# Patient Record
Sex: Male | Born: 1975 | Race: Black or African American | Hispanic: No | Marital: Single | Smoking: Former smoker
Health system: Southern US, Community
[De-identification: ages and names within clinical notes are randomized; demographics above are authoritative.]

## PROBLEM LIST (undated history)

## (undated) DIAGNOSIS — Z87828 Personal history of other (healed) physical injury and trauma: Secondary | ICD-10-CM

## (undated) DIAGNOSIS — J45909 Unspecified asthma, uncomplicated: Secondary | ICD-10-CM

## (undated) DIAGNOSIS — I639 Cerebral infarction, unspecified: Secondary | ICD-10-CM

## (undated) DIAGNOSIS — I10 Essential (primary) hypertension: Secondary | ICD-10-CM

## (undated) DIAGNOSIS — E785 Hyperlipidemia, unspecified: Secondary | ICD-10-CM

## (undated) DIAGNOSIS — G4730 Sleep apnea, unspecified: Secondary | ICD-10-CM

## (undated) DIAGNOSIS — Z8619 Personal history of other infectious and parasitic diseases: Secondary | ICD-10-CM

## (undated) DIAGNOSIS — T7840XA Allergy, unspecified, initial encounter: Secondary | ICD-10-CM

## (undated) DIAGNOSIS — K219 Gastro-esophageal reflux disease without esophagitis: Secondary | ICD-10-CM

## (undated) DIAGNOSIS — I82409 Acute embolism and thrombosis of unspecified deep veins of unspecified lower extremity: Secondary | ICD-10-CM

## (undated) HISTORY — PX: NASAL POLYP SURGERY: SHX186

## (undated) HISTORY — DX: Allergy, unspecified, initial encounter: T78.40XA

## (undated) HISTORY — DX: Personal history of other (healed) physical injury and trauma: Z87.828

## (undated) HISTORY — DX: Acute embolism and thrombosis of unspecified deep veins of unspecified lower extremity: I82.409

## (undated) HISTORY — DX: Sleep apnea, unspecified: G47.30

## (undated) HISTORY — DX: Personal history of other infectious and parasitic diseases: Z86.19

## (undated) HISTORY — DX: Hyperlipidemia, unspecified: E78.5

---

## 2000-02-13 ENCOUNTER — Emergency Department (HOSPITAL_COMMUNITY): Admission: EM | Admit: 2000-02-13 | Discharge: 2000-02-13 | Payer: Self-pay | Admitting: Emergency Medicine

## 2000-02-13 ENCOUNTER — Encounter: Payer: Self-pay | Admitting: Emergency Medicine

## 2008-02-05 ENCOUNTER — Emergency Department (HOSPITAL_BASED_OUTPATIENT_CLINIC_OR_DEPARTMENT_OTHER): Admission: EM | Admit: 2008-02-05 | Discharge: 2008-02-06 | Payer: Self-pay | Admitting: Emergency Medicine

## 2008-02-11 ENCOUNTER — Encounter: Payer: Self-pay | Admitting: Emergency Medicine

## 2008-02-12 ENCOUNTER — Inpatient Hospital Stay (HOSPITAL_COMMUNITY): Admission: EM | Admit: 2008-02-12 | Discharge: 2008-02-17 | Payer: Self-pay | Admitting: Internal Medicine
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
## 2010-09-16 NOTE — H&P (Signed)
NAME:  [name], [name] NO.:  [id number]   MEDICAL RECORD NO.:  [id number]          PATIENT TYPE:  INP   LOCATION:  2917                         FACILITY:  [facility]   PHYSICIAN:  Vania Rea, M.D. DATE OF BIRTH:  01-23-1976   DATE OF ADMISSION:  02/12/2008  DATE OF DISCHARGE:                              HISTORY & PHYSICAL   PRIMARY CARE PHYSICIAN:  Unassigned.   CHIEF COMPLAINT:  Persistent cough.   HISTORY OF PRESENT ILLNESS:  This is a 35 year old African American  gentleman, who is in good health until about 8-10 days ago when he  developed a sore throat, fever, chills, pains all over, and cough.  He  presented to the emergency room on February 05, 2008, was evaluated and a  chest x-ray done.  Chest x-ray showed no abnormality and he was  discharged home on symptomatic treatment, advised to return if he got  worse.  The patient got worse with recurring fever, chills, chest pain,  coughing up clear sputum and fevers, returned to the emergency room  yesterday evening and chest x-ray revealed bilateral upper lobe  pneumonia.  Hospital service was called to assist with management.   The patient denies any runny nose or sneezing and he denies any sick  contacts.   PAST MEDICAL HISTORY:  He does have a history of asthma, but uses  bronchodilators only rarely, last attack was back in summer.  He has a  history of acute renal failure secondary to Toradol.   ALLERGIES:  ROBAXIN and PENICILLIN causes hives.  TORADOL put him into  acute renal failure.   SOCIAL HISTORY:  He smokes half a pack per day, but discontinued for  about 2 weeks.  Denies alcohol or illicit drug use.  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   FAMILY HISTORY:  Significant for asthma, but otherwise unremarkable.   REVIEW OF SYSTEMS:  Other than noted above.  A 10-point review of  systems was unremarkable.   PHYSICAL EXAMINATION:  GENERAL:  An ill-looking young African American  gentleman  lying in bed, coughing frequently, and tachypneic.  VITAL SIGNS:  Initially in the emergency room, his temperature was  103.4, his pulse was 130, respiratory rate 32, and he was sating at 92%.  Currently his temperature is down to 98.7, blood pressure 130/88, pulse  is 84, respirations 20.  He is sating at 100% on oxygen.  HEENT:  His pupils are round and equal.  Mucous membranes pink and  anicteric.  He is mildly dehydrated.  NECK:  He has no cervical lymphadenopathy or thyromegaly.  CHEST:  He has coarse crackles down the right side of his chest.  CARDIOVASCULAR:  Regular rhythm.  ABDOMEN:  Soft and nontender.  EXTREMITIES:  Without edema.  He has 2+ pulses bilaterally.  CENTRAL NERVOUS SYSTEM:  Cranial nerves II through XII grossly intact  and he has no focal neurologic deficits.   LABORATORY DATA:  His white count is 16.  His hemoglobin 12.8, platelets  276.  His serum chemistry is notable only for glucose of 114, otherwise  unremarkable.  His lactic acid is 1.6.  His urine drug screen is  negative.  His urinalysis apart from specific gravity of 1.028 is  unremarkable.  His chest x-ray shows bilateral upper lobe pneumonia.   ASSESSMENT:  1. Bilateral pneumonia, likely viral or bacterial secondary to viral.  2. Dehydration, much improved.   PLAN:  I agree with having this gentleman step-down for hydration.  His  physical exam sounds much worse than a chest x-ray report.  We will  cover him with antibiotics for staph as well as give him p.r.n.  nebulizations and oxygen supplement.  Other plans as per orders.      Vania Rea, M.D.  Electronically Signed     LC/MEDQ  D:  02/12/2008  T:  02/12/2008  Job:  [id number]

## 2010-09-16 NOTE — Discharge Summary (Signed)
NAME:  [name] NO.:  [id number]   MEDICAL RECORD NO.:  [id number]          PATIENT TYPE:  INP   LOCATION:  5036                         FACILITY:  [facility]   PHYSICIAN:  Marcellus Scott, MD     DATE OF BIRTH:  1975/09/24   DATE OF ADMISSION:  02/12/2008  DATE OF DISCHARGE:                               DISCHARGE SUMMARY   PRIMARY MEDICAL DOCTOR:  Unassigned.   DISCHARGE DIAGNOSES:  1. Bilateral pneumonia.  2. Anemia - iron deficiency versus of chronic disease.  3. Tobacco abuse.  4. Leukocytosis - resolved.  5. Asthma - stable.   DISCHARGE MEDICATIONS:  1. Avelox 400 mg p.o. daily for 5 days then discontinue.  2. Ferrous sulfate 325 mg p.o. b.i.d.  3. Senokot-S 1 p.o. daily as needed.  4. Albuterol 90 mcg per spray MDI 2 puffs inhaled q.4-6h. p.r.n.  5. Tylenol 650 mg p.o. q.4-6h. p.r.n.   PROCEDURES:  1. Chest x-ray on February 16, 2008. Impression slight improvement in      bilateral pneumonia.  2. CT of the neck with contrast on February 11, 2008.  Impression      bilateral pneumonia.  Negative for abscess or mass in the neck.  3. Chest x-ray on October 10,2009.  Impression extensive bilateral      pneumonia.   PERTINENT LABS:  Blood cultures of February 11, 2008 had no growth.  CBCs  today with hemoglobin 11, hematocrit 33.2, white blood cell 9.7,  platelets 373.  BNP 137.  Basic metabolic panel yesterday unremarkable  with BUN 7, creatinine 0.85.  Sputum culture was normal, oropharyngeal  flora. Anemia panel with iron 10, total iron-binding capacity 163  percent saturation 6. Vitamin B12 of 542, serum folate 5.5, serum  ferritin 412.  Urine culture was no growth.  HIV antibody was  nonreactive.  Urine drug screen was positive for opiate but otherwise  negative.  Rapid strep screen negative.   CONSULTATIONS:  None.   HOSPITAL COURSE AND PATIENT DISPOSITION:  Please refer to the history  and physical note for initial admission details.  In  summary, Mr.  [name] is a very pleasant 35 year old African American male patient  with a history of asthma with infrequent flares who had some flu-like  symptoms with fevers, dry cough, myalgia 7-10 days prior to this  admission.  He was seen in the emergency room on February 05, 2008, and  chest x-ray at that time did not show any abnormality.  He was  discharged on symptomatic treatment.  However, the patient subsequently  got worse with high fevers, chills, chest pain, productive cough, and  presented again to the emergency department on February 11, 2008 when  chest x-ray revealed bilateral upper lobe pneumonia.  We were then asked  to admit the patient.  He had a temperature of 103.4 degrees Fahrenheit  on admission and was tachypneic at 32 beats per minute, tachycardiac  with 130 per minute, saturating at 92%.  He was initially admitted to  the step-down unit with diagnosis of bilateral pneumonia for further  evaluation and management.   PROBLEM LIST:  1. Bilateral pneumonia - post influenza versus community-acquired      pneumonia.  The patient was admitted to the hospital.  He was      placed on droplet precautions.  Cultures were sent off.  He was      empirically placed on IV Avelox and vancomycin.  He was provided      with pain medications.  He was also hydrated with IV fluids.  With      these measures the patient steadily has made recovery.  He was      subsequently transferred to the medical floor.  He is on day six of      IV antibiotics today.  He says he has not had any significant      complaints for the last 3 days.  He only has occasional cough which      is nonproductive.  There is no chest pain or dyspnea.  His lung      findings clinically are almost clear.  His chest x-ray shows slight      improvement.  However, radiology can lag behind the patient's      clinical improvement.  His HIV testing was done and negative.  The      patient at this time is stable to  be discharged and to follow-up      his chest x-ray in a couple of weeks as an outpatient.  He      indicates that he has five children, the youngest of whom is 84 year      old.  After the patient got sick the children were prophylactically      taken to the pediatrician, but they did not have any fevers.  I      have instructed [name] to continue to use a loop face mask      for another couple of days until his cough has resolved and he has      no fevers.  Also he was advised to get his children to see the      pediatrician immediately if they have any respiratory symptoms or      fever.  He verbalizes understanding.  The patient was empirically      placed on Tamiflu of which he has completed a 5-day course today.  2. Anemia which seems to be chronic, may be secondary to iron      deficiency versus anemia of chronic disease.  This may require      further evaluation as an outpatient as deemed necessary.  3. Tobacco abuse, cessation counseling done.  The patient declined a      nicotine patch.  4. Leukocytosis secondary to bilateral pneumonia - resolved.  5. Asthma which is stable.  We will provide a rescue inhaler for      p.r.n. use.  6. Dehydration - resolved.   The patient indicates that he does not have a primary medical doctor or  insurance.  Will obtain a case management consult for referral to  [facility].  But will also provide with a contact number for Dr.  Della Goo if she is accepting new patients and is willing to see  him.      Marcellus Scott, MD  Electronically Signed     AH/MEDQ  D:  02/17/2008  T:  02/17/2008  Job:  [id number]   cc:   Sharolyn Douglas, M.D.

## 2011-02-03 LAB — CBC
HCT: 38.1 — ABNORMAL LOW
HCT: 34.8 — ABNORMAL LOW
HCT: 35.2 — ABNORMAL LOW
Hemoglobin: 12.8 — ABNORMAL LOW
Hemoglobin: 11 — ABNORMAL LOW
Hemoglobin: 11.9 — ABNORMAL LOW
MCHC: 33.7
MCHC: 33.2
MCHC: 34.4
MCV: 83
MCV: 83.7
MCV: 85.3
Platelets: 276
Platelets: 270
Platelets: 340
RBC: 4.59
RBC: 3.82 — ABNORMAL LOW
RBC: 3.86 — ABNORMAL LOW
RDW: 13.6
RDW: 14.1
RDW: 14.2
RDW: 14.3
RDW: 14.8
WBC: 16.1 — ABNORMAL HIGH
WBC: 10.2
WBC: 14.8 — ABNORMAL HIGH
WBC: 9.7

## 2011-02-03 LAB — EXPECTORATED SPUTUM ASSESSMENT W GRAM STAIN, RFLX TO RESP C

## 2011-02-03 LAB — POCT TOXICOLOGY PANEL

## 2011-02-03 LAB — URINE CULTURE
Colony Count: NO GROWTH
Culture: NO GROWTH

## 2011-02-03 LAB — DIFFERENTIAL
Basophils Absolute: 0.2 — ABNORMAL HIGH
Eosinophils Absolute: 0.2
Lymphocytes Relative: 10 — ABNORMAL LOW
Neutro Abs: 13.1 — ABNORMAL HIGH
Neutrophils Relative %: 82 — ABNORMAL HIGH

## 2011-02-03 LAB — COMPREHENSIVE METABOLIC PANEL
ALT: 17
Albumin: 4.1
Albumin: 2.5 — ABNORMAL LOW
Alkaline Phosphatase: 107
Alkaline Phosphatase: 64
BUN: 12
BUN: 9
CO2: 25
Chloride: 105
Chloride: 106
Creatinine, Ser: 1
GFR calc non Af Amer: >60
Glucose, Bld: 114 — ABNORMAL HIGH
Potassium: 3.9
Potassium: 3.9
Sodium: 140
Total Bilirubin: 0.6
Total Bilirubin: 0.5
Total Protein: 6.3

## 2011-02-03 LAB — BASIC METABOLIC PANEL
BUN: 7
BUN: 8
CO2: 24
Calcium: 8.6
Chloride: 109
Creatinine, Ser: 1.13
GFR calc non Af Amer: >60
Glucose, Bld: 91
Glucose, Bld: 97
Potassium: 4.7
Sodium: 139

## 2011-02-03 LAB — CULTURE, RESPIRATORY W GRAM STAIN: Culture: NORMAL

## 2011-02-03 LAB — CULTURE, BLOOD (ROUTINE X 2)

## 2011-02-03 LAB — URINALYSIS, ROUTINE W REFLEX MICROSCOPIC
Bilirubin Urine: NEGATIVE
Glucose, UA: NEGATIVE
Hgb urine dipstick: NEGATIVE
Ketones, ur: 15 — AB
Leukocytes, UA: NEGATIVE
Nitrite: NEGATIVE
Protein, ur: 100 — AB
Specific Gravity, Urine: 1.028
Urobilinogen, UA: 1
pH: 6

## 2011-02-03 LAB — VITAMIN B12: Vitamin B-12: 542 (ref 211–911)

## 2011-02-03 LAB — IRON AND TIBC
Iron: 10 — ABNORMAL LOW
UIBC: 153

## 2011-02-03 LAB — RETICULOCYTES
RBC.: 4.26
Retic Count, Absolute: 34.1
Retic Ct Pct: 0.8

## 2011-02-03 LAB — URINE MICROSCOPIC-ADD ON

## 2011-02-03 LAB — PROTIME-INR: INR: 1.1

## 2011-02-03 LAB — FERRITIN: Ferritin: 412 — ABNORMAL HIGH (ref 22–322)

## 2011-02-03 LAB — HIV ANTIBODY (ROUTINE TESTING W REFLEX): HIV: NONREACTIVE

## 2014-01-09 ENCOUNTER — Emergency Department (HOSPITAL_BASED_OUTPATIENT_CLINIC_OR_DEPARTMENT_OTHER)
Admission: EM | Admit: 2014-01-09 | Discharge: 2014-01-10 | Disposition: A | Discharge status: Home / Self Care | Payer: Self-pay | Attending: Emergency Medicine | Admitting: Emergency Medicine

## 2014-01-09 ENCOUNTER — Encounter (HOSPITAL_BASED_OUTPATIENT_CLINIC_OR_DEPARTMENT_OTHER): Payer: Self-pay | Admitting: Emergency Medicine

## 2014-01-09 DIAGNOSIS — Z87891 Personal history of nicotine dependence: Secondary | ICD-10-CM | POA: Insufficient documentation

## 2014-01-09 DIAGNOSIS — Z8719 Personal history of other diseases of the digestive system: Secondary | ICD-10-CM | POA: Insufficient documentation

## 2014-01-09 DIAGNOSIS — Z880 Allergy status to penicillin: Secondary | ICD-10-CM | POA: Insufficient documentation

## 2014-01-09 DIAGNOSIS — I10 Essential (primary) hypertension: Secondary | ICD-10-CM | POA: Insufficient documentation

## 2014-01-09 DIAGNOSIS — J45909 Unspecified asthma, uncomplicated: Secondary | ICD-10-CM | POA: Insufficient documentation

## 2014-01-09 HISTORY — DX: Unspecified asthma, uncomplicated: J45.909

## 2014-01-09 HISTORY — DX: Gastro-esophageal reflux disease without esophagitis: K21.9

## 2014-01-09 NOTE — ED Notes (Signed)
Pt ambulatory to restroom

## 2014-01-09 NOTE — ED Notes (Signed)
MD at bedside. 

## 2014-01-09 NOTE — ED Provider Notes (Signed)
CSN: [id number]     Arrival date & time 01/09/14  2209 History  This chart was scribed for Terrie Grajales Smitty Cords, MD by Roxy Cedar, ED Scribe. This patient was seen in room MH12/MH12 and the patient's care was started at 11:42 PM.   Chief Complaint  Patient presents with  . Hypertension   Patient is a 38 y.o. male presenting with hypertension. The history is provided by the patient. No language interpreter was used.  Hypertension This is a new problem. The current episode started more than 1 week ago (1 year ago). The problem occurs constantly. The problem has not changed since onset.Pertinent negatives include no chest pain, no abdominal pain, no headaches and no shortness of breath. Nothing aggravates the symptoms. Nothing relieves the symptoms. He has tried nothing for the symptoms. The treatment provided no relief.   HPI Comments: ORMAND SENN is a 38 y.o. male who presents to the Emergency Department complaining of high BP. Per patient his BP measured 180 at home. Patient has not been treated for his hypertension, and patient states that he currently does not have a PCP. Patient states that his allergic to toradol, penicillin and robaxin.    Past Medical History  Diagnosis Date  . Asthma   . GERD (gastroesophageal reflux disease)    History reviewed. No pertinent past surgical history. No family history on file. History  Substance Use Topics  . Smoking status: Former Games developer  . Smokeless tobacco: Not on file  . Alcohol Use: Yes     Comment: occ    Review of Systems  Respiratory: Negative for shortness of breath.   Cardiovascular: Negative for chest pain.  Gastrointestinal: Negative for abdominal pain.  Neurological: Negative for speech difficulty, weakness, numbness and headaches.  All other systems reviewed and are negative.   Allergies  Penicillins; Robaxin; and Toradol  Home Medications   Prior to Admission medications   Not on File   Triage Vitals: BP  145/100  Pulse 75  Temp(Src) 98.1 F (36.7 C) (Oral)  Resp 16  Ht  (1.803 m)  Wt 217 lb (98.431 kg)  BMI 30.28 kg/m2  SpO2 98%  Physical Exam  Nursing note and vitals reviewed. Constitutional: He appears well-developed and well-nourished. No distress.  HENT:  Head: Normocephalic and atraumatic.  Mouth/Throat: Oropharynx is clear and moist. No oropharyngeal exudate.  Eyes: Conjunctivae and EOM are normal. Pupils are equal, round, and reactive to light. Right eye exhibits no discharge. Left eye exhibits no discharge. No scleral icterus.  Neck: Normal range of motion. Neck supple. No JVD present. No thyromegaly present.  Cardiovascular: Normal rate, regular rhythm, normal heart sounds and intact distal pulses.  Exam reveals no gallop and no friction rub.   No murmur heard. Pulmonary/Chest: Effort normal and breath sounds normal. No respiratory distress. He has no wheezes. He has no rales.  Abdominal: Soft. Bowel sounds are normal. He exhibits no distension and no mass. There is no tenderness.  Musculoskeletal: Normal range of motion. He exhibits no edema and no tenderness.  Lymphadenopathy:    He has no cervical adenopathy.  Neurological: He is alert. Coordination normal.  Cranial Nerves II-XII in tact.  Skin: Skin is warm and dry. No rash noted. No erythema.  Psychiatric: He has a normal mood and affect. His behavior is normal.    ED Course  Procedures (including critical care time)  DIAGNOSTIC STUDIES: Oxygen Saturation is 98% on RA, normal by my interpretation.    COORDINATION OF  CARE: 11:45 PM- Discussed plans to order diagnostic urinalysis. Pt advised of plan for treatment and pt agrees.  Labs Review Labs Reviewed - No data to display  Imaging Review No results found.   EKG Interpretation None      MDM   Final diagnoses:  None    RX for HCTZ and resource guide given.  Return for CP SOB DOE weakness numbness headaches or changes in speech  I  personally performed the services described in this documentation, which was scribed in my presence. The recorded information has been reviewed and is accurate.    Jasmine Awe, MD 01/10/14 [phone]

## 2014-01-09 NOTE — ED Notes (Addendum)
Pt sts he takes his bp at home and it has been intermittently high for the past year.  Has not seen a pmd about it.  Sts tonight it was 187/113 at home so he decided to come get it checked.  Reports intermittent headaches but none right now.

## 2014-01-10 ENCOUNTER — Encounter (HOSPITAL_BASED_OUTPATIENT_CLINIC_OR_DEPARTMENT_OTHER): Payer: Self-pay | Admitting: Emergency Medicine

## 2014-01-10 LAB — URINALYSIS, ROUTINE W REFLEX MICROSCOPIC
BILIRUBIN URINE: NEGATIVE
Glucose, UA: NEGATIVE mg/dL
HGB URINE DIPSTICK: NEGATIVE
KETONES UR: NEGATIVE mg/dL
Leukocytes, UA: NEGATIVE
NITRITE: NEGATIVE
PROTEIN: NEGATIVE mg/dL
Specific Gravity, Urine: 1.028 (ref 1.005–1.030)
UROBILINOGEN UA: 1 mg/dL (ref 0.0–1.0)
pH: 6 (ref 5.0–8.0)

## 2014-01-10 MED ORDER — HYDROCHLOROTHIAZIDE 12.5 MG PO TABS: 12.5000 mg | ORAL_TABLET | Freq: Every day | ORAL | Status: DC

## 2014-01-10 NOTE — Discharge Instructions (Signed)
How to Take Your Blood Pressure °HOW DO I GET A BLOOD PRESSURE MACHINE? °· You can buy an electronic home blood pressure machine at your local pharmacy. Insurance will sometimes cover the cost if you have a prescription. °· Ask your doctor what type of machine is best for you. There are different machines for your arm and your wrist. °· If you decide to buy a machine to check your blood pressure on your arm, first check the size of your arm so you can buy the right size cuff. To check the size of your arm:   °· Use a measuring tape that shows both inches and centimeters.   °· Wrap the measuring tape around the upper-middle part of your arm. You may need someone to help you measure.   °· Write down your arm measurement in both inches and centimeters.   °· To measure your blood pressure correctly, it is important to have the right size cuff.   °· If your arm is up to 13 inches (up to 34 centimeters), get an adult cuff size. °· If your arm is 13 to 17 inches (35 to 44 centimeters), get a large adult cuff size.   °·  If your arm is 17 to 20 inches (45 to 52 centimeters), get an adult thigh cuff.   °WHAT DO THE NUMBERS MEAN?  °· There are two numbers that make up your blood pressure. For example: 120/80. °· The first number (120 in our example) is called the "systolic pressure." It is a measure of the pressure in your blood vessels when your heart is pumping blood. °· The second number (80 in our example) is called the "diastolic pressure." It is a measure of the pressure in your blood vessels when your heart is resting between beats. °· Your doctor will tell you what your blood pressure should be. °WHAT SHOULD I DO BEFORE I CHECK MY BLOOD PRESSURE?  °· Try to rest or relax for at least 30 minutes before you check your blood pressure. °· Do not smoke. °· Do not have any drinks with caffeine, such as: °· Soda. °· Coffee. °· Tea. °· Check your blood pressure in a quiet room. °· Sit down and stretch out your arm on a table.  Keep your arm at about the level of your heart. Let your arm relax. °· Make sure that your legs are not crossed. °HOW DO I CHECK MY BLOOD PRESSURE? °· Follow the directions that came with your machine. °· Make sure you remove any tight-fighting clothing from your arm or wrist. Wrap the cuff around your upper arm or wrist. You should be able to fit a finger between the cuff and your arm. If you cannot fit a finger between the cuff and your arm, it is too tight and should be removed and rewrapped. °· Some units require you to manually pump up the arm cuff. °· Automatic units inflate the cuff when you press a button. °· Cuff deflation is automatic in both models. °· After the cuff is inflated, the unit measures your blood pressure and pulse. The readings are shown on a monitor. Hold still and breathe normally while the cuff is inflated. °· Getting a reading takes less than a minute. °· Some models store readings in a memory. Some provide a printout of readings. If your machine does not store your readings, keep a written record. °· Take readings with you to your next visit with your doctor. °Document Released: 04/02/2008 Document Revised: 09/04/2013 Document Reviewed: 06/15/2013 °ExitCare® Patient Information ©  2015 [employer], LLC. This information is not intended to replace advice given to you by your health care provider. Make sure you discuss any questions you have with your health care provider.  Emergency Department Resource Guide 1) Find a Doctor and Pay Out of Pocket Although you won't have to find out who is covered by your insurance plan, it is a good idea to ask around and get recommendations. You will then need to call the office and see if the doctor you have chosen will accept you as a new patient and what types of options they offer for patients who are self-pay. Some doctors offer discounts or will set up payment plans for their patients who do not have insurance, but you will need to ask so you aren't  surprised when you get to your appointment.  2) Contact Your Local Health Department Not all health departments have doctors that can see patients for sick visits, but many do, so it is worth a call to see if yours does. If you don't know where your local health department is, you can check in your phone book. The CDC also has a tool to help you locate your state's health department, and many state websites also have listings of all of their local health departments.  3) Find a Walk-in Clinic If your illness is not likely to be very severe or complicated, you may want to try a walk in clinic. These are popping up all over the country in pharmacies, drugstores, and shopping centers. They're usually staffed by nurse practitioners or physician assistants that have been trained to treat common illnesses and complaints. They're usually fairly quick and inexpensive. However, if you have serious medical issues or chronic medical problems, these are probably not your best option.  No Primary Care Doctor: - Call Health Connect at  [phone] - they can help you locate a primary care doctor that  accepts your insurance, provides certain services, etc. - Physician Referral Service- [phone]  Chronic Pain Problems: Organization         Address  Phone   Notes  [facility]  [phone] Patients need to be referred by their primary care doctor.   Medication Assistance: Organization         Address  Phone   Notes  [facility] [address] [place] [place], [place] [address] [phone] --Must be a resident of [facility] -- Must have NO insurance coverage whatsoever (no Medicaid/ Medicare, etc.) -- The pt. MUST have a primary care doctor that directs their care regularly and follows them in the community   [facility]ist  [phone]   [employer]  [phone]    Agencies that provide inexpensive medical care: Organization          Address  Phone   Notes  [facility]  [phone]   [facility]    [phone]   [facility] [address] [place], [place] [address] [phone]   [facility] [address], [place] [phone]   Planned Parenthood    [phone]   [facility]    [phone]   [facility]  [address], [place] Phone:  [phone], Fax:  [phone] Hours of Operation:  9 am - 6 pm, M-F.  Also accepts Medicaid/Medicare and self-pay.  [facility]  [address], [place] Phone: [phone], Fax: [phone]. Hours of Operation:  8:30 am - 5:30 pm, M-F.  Also accepts Medicaid and self-pay.  [facility] [place] [address] [place] Phone: [phone]   [facility] [address] Natasha Bence [place], [place] [phone], Ext. [phone] Mondays & Thursdays: 7-9 AM.  First 15 patients are seen on a first come, first serve basis.    Medicaid-accepting [facility] Providers:  Organization         Address  Phone   Notes  [facility] [address], [place] [phone] Also accepts self-pay patients.  [facility] [address] Laurell Josephs [place], [place]  [phone]   [facility] [address], [place] [phone]   [facility] [address], [place] [phone]   Renaye Rakers [address], [place]   [phone] Only accepts [place] Access [place] patients after they have their name applied to their card.   Self-Pay (no insurance) in [facility]:  Organization         Address  Phone   Notes  Sickle Cell Patients, [facility] [address] [place], [place] [phone]   [facility] [address] [place], [place] [phone]     [facility] [place]  [address], [place] [phone]   [facility]/Dr. Osei-Bonsu  [address], [place] or [address], [place] [phone] Phone number for both [place] and [place] locations is the same.  [facility]gent Medical and [facility] [address] [place] [phone]   [facility] [address] [place] or [address] [phone]   [facility] [address] [place] [phone], phone; [phone], fax Sees patients 1st and 3rd Saturday of every month.  Must not qualify for public or private insurance (i.e. Medicaid, Medicare, [place] Health Choice, Veterans' Benefits)  Household income should be no more than 200% of the poverty level The clinic cannot treat you if you are pregnant or think you are pregnant  Sexually transmitted diseases are not treated at the clinic.    Dental Care: Organization         Address  Phone  Notes  [facility] of [facility] [address] [place], [place] [phone] Accepts children up to age 43 who are enrolled in [place] or [place] Health Choice; pregnant women with a Medicaid card; and children who have applied for Medicaid or [place] Health Choice, but were declined, whose parents can pay a reduced fee at time of service.  [facility] Department of [facility]  [address], [place] [phone] Accepts children up to age 70 who are enrolled in [place] or [place] Health Choice; pregnant women with a Medicaid card; and children who have applied for Medicaid or [place] Health Choice, but were declined, whose parents can pay a reduced fee at time of service.  [facility]  [address] [place], [place] [phone] Patients are seen by appointment only. Walk-ins are not accepted. [facility] will see patients 22  years of age and older. Monday - Tuesday (8am-5pm) Most Wednesdays (8:30-5pm) $30 per visit, cash only  [employer]  Adult Dental Access PROGRAM  [address], [facility] [phone] Patients are seen by appointment only. Walk-ins are not accepted. [facility] will see patients 33 years of age and older. One Wednesday Evening (Monthly: Volunteer Based).  $30 per visit, cash only  [employer]  [phone] for adults; Children under age 33, call Graduate Pediatric Dentistry at [phone]. Children aged 52-14, please call [phone] to request a pediatric application.  Dental services are provided in all areas of dental care including fillings, crowns and bridges, complete and partial dentures, implants, gum treatment, root canals, and extractions. Preventive care is also provided. Treatment is provided to both adults and children. Patients are selected via a lottery and there is often a waiting list.   [facility] [address], [place]  [phone] [email or web address]   [facility] [address] [place], [place] [phone], Ext. [phone] Second and Fourth Thursday of each month, opens at 6:30 AM; Clinic ends at 9 AM.  Patients are seen on a first-come first-served basis, and a limited number are seen during each clinic.   [facility]  [address] Ether Griffins [place], [place] [phone]   Eligibility Requirements You must have lived in [place], [place], or [place] counties for at least the last three months.   You cannot be eligible for state or federal sponsored [place], including [employer], [place], or [employer].   You generally cannot be eligible for healthcare insurance through your employer.    How to apply: Eligibility screenings are held every Tuesday and Wednesday afternoon from 1:00 pm until 4:00 pm. You do not need an appointment for the interview!  [facility]  [address], [place], [place] [phone]   [facility] Health Department  [phone]   [facility] Health Department  [phone]   [facility] Health Department  [phone]    Behavioral Health Resources in the Community: Intensive Outpatient Programs Organization         Address  Phone  Notes  [facility] Services [address] [place], [place] [phone]   [facility] Outpatient [address] [place], [place] [phone]   [facility] [address] [place], [place]  [phone]   [facility] Mental Health [address]  [place], [place] [phone] or [phone]   Substance Abuse Resources Organization         Address  Phone  Notes  Al[facility] Drug Services  [phone]   [facility]  [phone]   [facility] [place]  [phone]   Floydene Flock  [phone]   Residential & Outpatient Substance Abuse Program  [phone]   Psychological Services Organization         Address  Phone  Notes  [facility] Behavior[facility] Health  [phone]   [facility] Services  [phone]   [facility] Mental Health [address] [place] [phone] or [phone]    Mobile Crisis Teams Organization         Address  Phone  Notes  [facility], Mobile Crisis Care Unit  [phone]   [facility]  [address] [place], [place] [phone]   Doristine Locks [address] [place] [phone]    Self-Help/Support Groups Organization         Address  Phone             Notes  [facility]  [facility] [place] - variety of support groups  336- [phone] Call for more information  Narcotics Anonymous (NA), [facility] [address], [employer] [place]  2 meetings at this location   Statistician         Address  Phone  Notes  [facility] [address],    [place]   [phone]   [facility]  [address], [place] [address], [place], [place] [phone]   [facility] [address] [place], [place] [phone] Admissions: 8am-3pm M-F  [facility] [address],    [place], [place] [phone]   [facility] [address] [place], [place], [place] [phone]   [facility] [address],  [place], [place] [phone]   [facility] - Intensive Outpatient [address] Laurell Josephs [address], [place], [place] [phone]   [facility] (Addiction [facility] [address] [place]  [place], [place] [phone] or [phone]   [facility] ([facility]) [address], [place], [place] [phone] Accepts Medicaid  [facility] [address],  [place] [phone] Substance Abuse/Addiction Treatment   [facility] Organization         Address  Phone  Notes  [facility]  [phone]   Angie Fava, PhD [address] Ervin Knack [place], [place]   [phone] or [phone]   [facility] Behavioral   [address] [place], [place] [phone]   [facility] [address], [place], [place] [phone] Insurance/Medicaid/sponsorship through [facility] and Families [address]                                    [place], [place] [phone] Therapy/tele-psych/case  [facility] [address], [place] [phone]    Dr. Lolly Mustache  [phone]   [facility]  [employer] [facility] Dept. 1) [address] [place] 2) [address] [place] 3)  [address], [place] [phone]  [phone]   [facility] Child Abuse Hotline [phone])859-8621 or [phone] (After Hours)

## 2015-07-26 ENCOUNTER — Emergency Department (HOSPITAL_BASED_OUTPATIENT_CLINIC_OR_DEPARTMENT_OTHER)
Admission: EM | Admit: 2015-07-26 | Discharge: 2015-07-26 | Disposition: A | Discharge status: Home / Self Care | Payer: Self-pay | Attending: Emergency Medicine | Admitting: Emergency Medicine

## 2015-07-26 ENCOUNTER — Encounter (HOSPITAL_BASED_OUTPATIENT_CLINIC_OR_DEPARTMENT_OTHER): Payer: Self-pay | Admitting: Emergency Medicine

## 2015-07-26 DIAGNOSIS — Z8719 Personal history of other diseases of the digestive system: Secondary | ICD-10-CM | POA: Insufficient documentation

## 2015-07-26 DIAGNOSIS — I10 Essential (primary) hypertension: Secondary | ICD-10-CM | POA: Insufficient documentation

## 2015-07-26 DIAGNOSIS — Z79899 Other long term (current) drug therapy: Secondary | ICD-10-CM | POA: Insufficient documentation

## 2015-07-26 DIAGNOSIS — Z880 Allergy status to penicillin: Secondary | ICD-10-CM | POA: Insufficient documentation

## 2015-07-26 DIAGNOSIS — R202 Paresthesia of skin: Secondary | ICD-10-CM | POA: Insufficient documentation

## 2015-07-26 DIAGNOSIS — J45909 Unspecified asthma, uncomplicated: Secondary | ICD-10-CM | POA: Insufficient documentation

## 2015-07-26 DIAGNOSIS — Z87891 Personal history of nicotine dependence: Secondary | ICD-10-CM | POA: Insufficient documentation

## 2015-07-26 HISTORY — DX: Essential (primary) hypertension: I10

## 2015-07-26 LAB — CBC WITH DIFFERENTIAL/PLATELET
BASOS ABS: 0 10*3/uL (ref 0.0–0.1)
BASOS PCT: 1 %
EOS ABS: 0.1 10*3/uL (ref 0.0–0.7)
EOS PCT: 1 %
HCT: 40.7 % (ref 39.0–52.0)
Hemoglobin: 13.4 g/dL (ref 13.0–17.0)
Lymphocytes Relative: 35 %
Lymphs Abs: 3 10*3/uL (ref 0.7–4.0)
MCH: 26.8 pg (ref 26.0–34.0)
MCHC: 32.9 g/dL (ref 30.0–36.0)
MCV: 81.4 fL (ref 78.0–100.0)
MONO ABS: 0.8 10*3/uL (ref 0.1–1.0)
Monocytes Relative: 9 %
Neutro Abs: 4.6 10*3/uL (ref 1.7–7.7)
Neutrophils Relative %: 54 %
PLATELETS: 233 10*3/uL (ref 150–400)
RBC: 5 MIL/uL (ref 4.22–5.81)
RDW: 13.9 % (ref 11.5–15.5)
WBC: 8.5 10*3/uL (ref 4.0–10.5)

## 2015-07-26 LAB — BASIC METABOLIC PANEL
Anion gap: 9 (ref 5–15)
BUN: 13 mg/dL (ref 6–20)
CALCIUM: 9.2 mg/dL (ref 8.9–10.3)
CO2: 28 mmol/L (ref 22–32)
Chloride: 104 mmol/L (ref 101–111)
Creatinine, Ser: 1.21 mg/dL (ref 0.61–1.24)
GFR calc Af Amer: >60 mL/min (ref >60)
GLUCOSE: 109 mg/dL — AB (ref 65–99)
Potassium: 3.8 mmol/L (ref 3.5–5.1)
SODIUM: 141 mmol/L (ref 135–145)

## 2015-07-26 MED ORDER — HYDROCHLOROTHIAZIDE 12.5 MG PO CAPS
12.5000 mg | ORAL_CAPSULE | Freq: Every day | ORAL | Status: DC
Start: 2015-07-26 — End: 2016-01-28

## 2015-07-26 NOTE — ED Provider Notes (Signed)
CSN: [id number]     Arrival date & time 07/26/15  1402 History   First MD Initiated Contact with Patient 07/26/15 1423     Chief Complaint  Patient presents with  . Hypertension     (Consider location/radiation/quality/duration/timing/severity/associated sxs/prior Treatment) HPI Comments: Patient is a 40 year old male with history of hypertension. He presents for evaluation of elevated blood pressure. He reports feeling numbness in his arms and legs and head intermittently for the past couple of weeks. Denies visual changes. He denies any head pain. He denies any chest pain or palpitations.  Patient is a 40 y.o. male presenting with hypertension. The history is provided by the patient.  Hypertension This is a new problem. The problem occurs constantly. The problem has not changed since onset.Pertinent negatives include no chest pain, no headaches and no shortness of breath. Nothing aggravates the symptoms. Nothing relieves the symptoms. He has tried nothing for the symptoms. The treatment provided no relief.    Past Medical History  Diagnosis Date  . Asthma   . GERD (gastroesophageal reflux disease)   . Hypertension    History reviewed. No pertinent past surgical history. No family history on file. Social History  Substance Use Topics  . Smoking status: Former Games developermoker  . Smokeless tobacco: None  . Alcohol Use: Yes     Comment: occ    Review of Systems  Respiratory: Negative for shortness of breath.   Cardiovascular: Negative for chest pain.  Neurological: Negative for headaches.  All other systems reviewed and are negative.     Allergies  Penicillins; Robaxin; and Toradol  Home Medications   Prior to Admission medications   Medication Sig Start Date End Date Taking? Authorizing Provider  hydrochlorothiazide (HYDRODIURIL) 12.5 MG tablet Take 1 tablet (12.5 mg total) by mouth daily. 01/10/14   April Palumbo, MD   BP 141/97 mmHg  Pulse 84  Temp(Src) 98.8 F (37.1 C)  (Oral)  Resp 20  Ht 5\' 9"  (1.753 m)  Wt 220 lb (99.791 kg)  BMI 32.47 kg/m2  SpO2 100% Physical Exam  Constitutional: He is oriented to person, place, and time. He appears well-developed and well-nourished. No distress.  HENT:  Head: Normocephalic and atraumatic.  Mouth/Throat: Oropharynx is clear and moist.  Eyes: EOM are normal. Pupils are equal, round, and reactive to light.  Neck: Normal range of motion. Neck supple.  Cardiovascular: Normal rate, regular rhythm and normal heart sounds.   No murmur heard. Pulmonary/Chest: Effort normal and breath sounds normal. No respiratory distress. He has no wheezes. He has no rales.  Abdominal: Soft. Bowel sounds are normal. He exhibits no distension. There is no tenderness.  Musculoskeletal: Normal range of motion. He exhibits no edema.  Neurological: He is alert and oriented to person, place, and time. No cranial nerve deficit. He exhibits normal muscle tone. Coordination normal.  Skin: Skin is warm and dry. He is not diaphoretic.  Nursing note and vitals reviewed.   ED Course  Procedures (including critical care time) Labs Review Labs Reviewed  BASIC METABOLIC PANEL  CBC WITH DIFFERENTIAL/PLATELET    Imaging Review No results found. I have personally reviewed and evaluated these images and lab results as part of my medical decision-making.   EKG Interpretation   Date/Time:  Friday July 26 2015 14:56:16 EDT Ventricular Rate:  79 PR Interval:  175 QRS Duration: 81 QT Interval:  369 QTC Calculation: 423 R Axis:   9 Text Interpretation:  Sinus rhythm Borderline T wave abnormalities  Borderline ST  elevation, anterior leads Confirmed by Deatra Mcmahen  MD, Gracianna Vink  [phone]) on 07/26/2015 3:07:40 PM      MDM   Final diagnoses:  None    Patient presents with complaints of tingling to his hands and feet and face. He is concerned this is related to his blood pressure. His blood pressure was slightly elevated, however nothing that I feel  would cause this. His laboratory studies are unremarkable. As he has been off his medications for the past several months and blood pressures are running high, I will prescribe his hydrochlorothiazide that he was previously taking. His largest obstacle at this point appears to be lack of access to healthcare. He will be provided with a resource guide to hopefully make arrangements for follow-up.    Geoffery Lyons, MD 07/26/15 [phone]

## 2015-07-26 NOTE — ED Notes (Signed)
States b/ps been running high form a couple of months  Also intermittent numbness of legs, arms  And head

## 2015-07-26 NOTE — Discharge Instructions (Signed)
Hydrochlorothiazide as prescribed.  Keep a record of your blood pressures over the next several weeks, and try to obtain a primary doctor to follow up these results.   Hypertension Hypertension, commonly called high blood pressure, is when the force of blood pumping through your arteries is too strong. Your arteries are the blood vessels that carry blood from your heart throughout your body. A blood pressure reading consists of a higher number over a lower number, such as 110/72. The higher number (systolic) is the pressure inside your arteries when your heart pumps. The lower number (diastolic) is the pressure inside your arteries when your heart relaxes. Ideally you want your blood pressure below 120/80. Hypertension forces your heart to work harder to pump blood. Your arteries may become narrow or stiff. Having untreated or uncontrolled hypertension can cause heart attack, stroke, kidney disease, and other problems. RISK FACTORS Some risk factors for high blood pressure are controllable. Others are not.  Risk factors you cannot control include:   Race. You may be at higher risk if you are African American.  Age. Risk increases with age.  Gender. Men are at higher risk than women before age 40 years. After age 40, women are at higher risk than men. Risk factors you can control include:  Not getting enough exercise or physical activity.  Being overweight.  Getting too much fat, sugar, calories, or salt in your diet.  Drinking too much alcohol. SIGNS AND SYMPTOMS Hypertension does not usually cause signs or symptoms. Extremely high blood pressure (hypertensive crisis) may cause headache, anxiety, shortness of breath, and nosebleed. DIAGNOSIS To check if you have hypertension, your health care provider will measure your blood pressure while you are seated, with your arm held at the level of your heart. It should be measured at least twice using the same arm. Certain conditions can cause a  difference in blood pressure between your right and left arms. A blood pressure reading that is higher than normal on one occasion does not mean that you need treatment. If it is not clear whether you have high blood pressure, you may be asked to return on a different day to have your blood pressure checked again. Or, you may be asked to monitor your blood pressure at home for 1 or more weeks. TREATMENT Treating high blood pressure includes making lifestyle changes and possibly taking medicine. Living a healthy lifestyle can help lower high blood pressure. You may need to change some of your habits. Lifestyle changes may include:  Following the DASH diet. This diet is high in fruits, vegetables, and whole grains. It is low in salt, red meat, and added sugars.  Keep your sodium intake below 2,300 mg per day.  Getting at least 30-45 minutes of aerobic exercise at least 4 times per week.  Losing weight if necessary.  Not smoking.  Limiting alcoholic beverages.  Learning ways to reduce stress. Your health care provider may prescribe medicine if lifestyle changes are not enough to get your blood pressure under control, and if one of the following is true:  You are 9318-40 years of age and your systolic blood pressure is above 140.  You are 40 years of age or older, and your systolic blood pressure is above 150.  Your diastolic blood pressure is above 90.  You have diabetes, and your systolic blood pressure is over 140 or your diastolic blood pressure is over 90.  You have kidney disease and your blood pressure is above 140/90.  You  have heart disease and your blood pressure is above 140/90. Your personal target blood pressure may vary depending on your medical conditions, your age, and other factors. HOME CARE INSTRUCTIONS  Have your blood pressure rechecked as directed by your health care provider.   Take medicines only as directed by your health care provider. Follow the directions  carefully. Blood pressure medicines must be taken as prescribed. The medicine does not work as well when you skip doses. Skipping doses also puts you at risk for problems.  Do not smoke.   Monitor your blood pressure at home as directed by your health care provider. SEEK MEDICAL CARE IF:   You think you are having a reaction to medicines taken.  You have recurrent headaches or feel dizzy.  You have swelling in your ankles.  You have trouble with your vision. SEEK IMMEDIATE MEDICAL CARE IF:  You develop a severe headache or confusion.  You have unusual weakness, numbness, or feel faint.  You have severe chest or abdominal pain.  You vomit repeatedly.  You have trouble breathing. MAKE SURE YOU:   Understand these instructions.  Will watch your condition.  Will get help right away if you are not doing well or get worse.   This information is not intended to replace advice given to you by your health care provider. Make sure you discuss any questions you have with your health care provider.   Document Released: 04/20/2005 Document Revised: 09/04/2014 Document Reviewed: 02/10/2013 Elsevier Interactive Patient Education 2016 [employer]  Paresthesia Paresthesia is an abnormal burning or prickling sensation. This sensation is generally felt in the hands, arms, legs, or feet. However, it may occur in any part of the body. Usually, it is not painful. The feeling may be described as:  Tingling or numbness.  Pins and needles.  Skin crawling.  Buzzing.  Limbs falling asleep.  Itching. Most people experience temporary (transient) paresthesia at some time in their lives. Paresthesia may occur when you breathe too quickly (hyperventilation). It can also occur without any apparent cause. Commonly, paresthesia occurs when pressure is placed on a nerve. The sensation quickly goes away after the pressure is removed. For some people, however, paresthesia is a long-lasting  (chronic) condition that is caused by an underlying disorder. If you continue to have paresthesia, you may need further medical evaluation. HOME CARE INSTRUCTIONS Watch your condition for any changes. Taking the following actions may help to lessen any discomfort that you are feeling:  Avoid drinking alcohol.  Try acupuncture or massage to help relieve your symptoms.  Keep all follow-up visits as directed by your health care provider. This is important. SEEK MEDICAL CARE IF:  You continue to have episodes of paresthesia.  Your burning or prickling feeling gets worse when you walk.  You have pain, cramps, or dizziness.  You develop a rash. SEEK IMMEDIATE MEDICAL CARE IF:  You feel weak.  You have trouble walking or moving.  You have problems with speech, understanding, or vision.  You feel confused.  You cannot control your bladder or bowel movements.  You have numbness after an injury.  You faint.   This information is not intended to replace advice given to you by your health care provider. Make sure you discuss any questions you have with your health care provider.   Document Released: 04/10/2002 Document Revised: 09/04/2014 Document Reviewed: 04/16/2014 Elsevier Interactive Patient Education [employer]

## 2016-01-28 ENCOUNTER — Encounter (HOSPITAL_BASED_OUTPATIENT_CLINIC_OR_DEPARTMENT_OTHER): Payer: Self-pay | Admitting: *Deleted

## 2016-01-28 ENCOUNTER — Emergency Department (HOSPITAL_BASED_OUTPATIENT_CLINIC_OR_DEPARTMENT_OTHER)
Admission: EM | Admit: 2016-01-28 | Discharge: 2016-01-29 | Disposition: A | Discharge status: Home / Self Care | Payer: Self-pay | Attending: Dermatology | Admitting: Dermatology

## 2016-01-28 DIAGNOSIS — R05 Cough: Secondary | ICD-10-CM | POA: Insufficient documentation

## 2016-01-28 DIAGNOSIS — J45909 Unspecified asthma, uncomplicated: Secondary | ICD-10-CM | POA: Insufficient documentation

## 2016-01-28 DIAGNOSIS — Z87891 Personal history of nicotine dependence: Secondary | ICD-10-CM | POA: Insufficient documentation

## 2016-01-28 DIAGNOSIS — I10 Essential (primary) hypertension: Secondary | ICD-10-CM | POA: Insufficient documentation

## 2016-01-28 NOTE — ED Triage Notes (Signed)
Cough, congestion x 1 day.

## 2016-01-29 NOTE — ED Notes (Signed)
Pt told RN that he did not want to wait to be seen.  Pt advised that his BP was high.  Pt ambulatory out of the dept in no distress.

## 2016-09-14 ENCOUNTER — Ambulatory Visit (INDEPENDENT_AMBULATORY_CARE_PROVIDER_SITE_OTHER): Payer: BLUE CROSS/BLUE SHIELD | Admitting: Family Medicine

## 2016-09-14 ENCOUNTER — Other Ambulatory Visit: Payer: Self-pay | Admitting: Family Medicine

## 2016-09-14 ENCOUNTER — Encounter: Payer: Self-pay | Admitting: Family Medicine

## 2016-09-14 VITALS — BP 170/100 | HR 87 | Temp 98.2°F | Ht 71.0 in | Wt 226.0 lb

## 2016-09-14 DIAGNOSIS — J45909 Unspecified asthma, uncomplicated: Secondary | ICD-10-CM

## 2016-09-14 DIAGNOSIS — Z131 Encounter for screening for diabetes mellitus: Secondary | ICD-10-CM | POA: Diagnosis not present

## 2016-09-14 DIAGNOSIS — I10 Essential (primary) hypertension: Secondary | ICD-10-CM | POA: Diagnosis not present

## 2016-09-14 DIAGNOSIS — J4520 Mild intermittent asthma, uncomplicated: Secondary | ICD-10-CM | POA: Diagnosis not present

## 2016-09-14 DIAGNOSIS — E785 Hyperlipidemia, unspecified: Secondary | ICD-10-CM

## 2016-09-14 HISTORY — DX: Unspecified asthma, uncomplicated: J45.909

## 2016-09-14 LAB — COMPREHENSIVE METABOLIC PANEL
ALT: 23 U/L (ref 0–53)
AST: 18 U/L (ref 0–37)
Albumin: 4.2 g/dL (ref 3.5–5.2)
Alkaline Phosphatase: 79 U/L (ref 39–117)
BILIRUBIN TOTAL: 0.5 mg/dL (ref 0.2–1.2)
BUN: 15 mg/dL (ref 6–23)
CHLORIDE: 104 meq/L (ref 96–112)
CO2: 29 meq/L (ref 19–32)
CREATININE: 1.15 mg/dL (ref 0.40–1.50)
Calcium: 9.3 mg/dL (ref 8.4–10.5)
GFR: 90.08 mL/min (ref >60.00)
Glucose, Bld: 99 mg/dL (ref 70–99)
Potassium: 3.9 mEq/L (ref 3.5–5.1)
SODIUM: 139 meq/L (ref 135–145)
Total Protein: 7.5 g/dL (ref 6.0–8.3)

## 2016-09-14 LAB — LIPID PANEL
CHOL/HDL RATIO: 5
Cholesterol: 240 mg/dL — ABNORMAL HIGH (ref 0–200)
HDL: 44 mg/dL (ref >39.00)
LDL CALC: 178 mg/dL — AB (ref 0–99)
NONHDL: 196.02
Triglycerides: 88 mg/dL (ref 0.0–149.0)
VLDL: 17.6 mg/dL (ref 0.0–40.0)

## 2016-09-14 LAB — HEMOGLOBIN A1C: Hgb A1c MFr Bld: 6.3 % (ref 4.6–6.5)

## 2016-09-14 MED ORDER — FLUTICASONE PROPIONATE 50 MCG/ACT NA SUSP: 2.0000 | Freq: Every day | NASAL | 6 refills | Status: AC

## 2016-09-14 MED ORDER — LISINOPRIL-HYDROCHLOROTHIAZIDE 20-25 MG PO TABS: 1.0000 | ORAL_TABLET | Freq: Every day | ORAL | 2 refills | Status: DC

## 2016-09-14 MED ORDER — CETIRIZINE HCL 10 MG PO TABS: 10.0000 mg | ORAL_TABLET | Freq: Every day | ORAL | 5 refills | Status: DC

## 2016-09-14 MED ORDER — ATORVASTATIN CALCIUM 20 MG PO TABS: 20.0000 mg | ORAL_TABLET | Freq: Every day | ORAL | 3 refills | Status: DC

## 2016-09-14 MED ORDER — ALBUTEROL SULFATE 108 (90 BASE) MCG/ACT IN AEPB: 1.0000 | INHALATION_SPRAY | Freq: Four times a day (QID) | RESPIRATORY_TRACT | 2 refills | Status: DC | PRN

## 2016-09-14 NOTE — Progress Notes (Signed)
Chief Complaint  Patient presents with  . Establish Care    pt want to discuss elevated BP and cholesterol, allergies       New Patient Visit SUBJECTIVE: HPI: [name] is an 41 y.o.male who is being seen for establishing care.  Hypertension Patient presents for hypertension follow up. He does monitor home blood pressures. Does not check enough to remember. He is not currently taking any medication routinely.  He is not adhering to a healthy diet overall. Exercise: No scheduled exercise, active at work  Dyslipidemia Patient presents for dyslipidemia follow up. Compliance with treatment thus far has been good. He does not remember if he had myalgias. He is not adhering to a low sodium and low fat diet. The patient exercises never.  The patient is not known to have coexisting coronary artery disease.  He has a hx of allergies/asthma as well. Allergens happen to be a trigger for his asthma. He normally never needs his rescue inhaler, which he does not have, but feels his breathing has not been as good lately. He has been experiencing a runny nose, itchy eyes, and PND. No fevers or sick contacts.   Allergies  Allergen Reactions  . Penicillins   . Robaxin [Methocarbamol]   . Toradol [Ketorolac Tromethamine]     Past Medical History:  Diagnosis Date  . Allergy   . Asthma   . Asthma 09/14/2016  . GERD (gastroesophageal reflux disease)   . History of chicken pox   . Hyperlipidemia   . Hypertension    Past Surgical History:  Procedure Laterality Date  . NO PAST SURGERIES     Social History   Social History  . Marital status: Single   Social History Main Topics  . Smoking status: Former Smoker    Packs/day: 1.00    Years: 16.00    Start date: 05/05/1991    Quit date: 05/05/2007  . Smokeless tobacco: Never Used  . Alcohol use Yes     Comment: Once in great while  . Drug use: No  . Sexual activity: Yes   Family History  Problem Relation Age of Onset  .  Hypertension Mother   . Diabetes Mother   . Heart attack Maternal Grandmother    Takes no medication routinely.   ROS Cardiovascular: Denies chest pain  Respiratory: Denies current dyspnea   OBJECTIVE: BP (!) 170/100 (BP Location: Left Arm, Patient Position: Sitting, Cuff Size: Large)   Pulse 87   Temp 98.2 F (36.8 C) (Oral)   Ht 5\' 11"  (1.803 m)   Wt 226 lb (102.5 kg)   SpO2 98%   BMI 31.52 kg/m   Constitutional: -  VS reviewed -  Well developed, well nourished, appears stated age -  No apparent distress  Psychiatric: -  Oriented to person, place, and time -  Memory intact -  Affect and mood normal -  Fluent conversation, good eye contact -  Judgment and insight age appropriate  Eye: -  Conjunctivae clear, no discharge -  Pupils symmetric, round, reactive to light  ENMT: -  Oral mucosa without lesions, tongue and uvula midline    Tonsils not enlarged, no erythema, no exudate, trachea midline    Pharynx moist, no lesions, no erythema  Neck: -  No gross swelling, no palpable masses -  Thyroid midline, not enlarged, mobile, no palpable masses  Cardiovascular: -  RRR, no murmurs -  No LE edema  Respiratory: -  Normal respiratory effort, no accessory muscle  use, no retraction -  Breath sounds equal, no wheezes, no ronchi, no crackles  Neurological:  -  CN II - XII grossly intact -  Sensation grossly intact to light touch, equal bilaterally  Musculoskeletal: -  No clubbing, no cyanosis -  Gait normal  Skin: -  No significant lesion on inspection -  Warm and dry to palpation   ASSESSMENT/PLAN: Essential hypertension - Plan: Comprehensive metabolic panel, lisinopril-hydrochlorothiazide ([name],ZESTORETIC) 20-25 MG tablet, Basic metabolic panel  Hyperlipidemia, unspecified hyperlipidemia type - Plan: Lipid panel  Mild intermittent asthma without complication - Plan: fluticasone (FLONASE) 50 MCG/ACT nasal spray, cetirizine (ZYRTEC ALLERGY) 10 MG tablet  Asthma due to  environmental allergies - Plan: Albuterol Sulfate 108 (90 Base) MCG/ACT AEPB  Screening for diabetes mellitus - Plan: Hemoglobin A1c  Orders as above. Discussed monitoring BP at home, bring log to next appt. Counseled on diet and exercise. Healthy diet handout given. Will calculate 10 yr CVD first rather than place him on statin currently. Will start Lipitor 40 mg daily if needed. OTC allergy medicine called in, list also given; compare prices and get whatever is cheaper. Patient should return in 4 weeks. The patient voiced understanding and agreement to the plan.   Jilda Roche [place], DO 09/14/16  10:26 AM

## 2016-09-14 NOTE — Patient Instructions (Addendum)
Around 3 times per week, check your blood pressure 4 times per day. Twice in the morning and twice in the evening. The readings should be at least one minute apart. Write down these values and bring them to your next nurse visit/appointment.  When you check your BP, make sure you have been doing something calm/relaxing 5 minutes prior to checking. Both feet should be flat on the floor and you should be sitting. Use your left arm and make sure it is in a relaxed position (on a table), and that the cuff is at the approximate level/height of your heart.  Aim to do some physical exertion for 150 minutes per week. This is typically divided into 5 days per week, 30 minutes per day. The activity should be enough to get your heart rate up. Anything is better than nothing if you have time constraints.   Healthy Eating Plan Many factors influence your heart health, including eating and exercise habits. Heart (coronary) risk increases with abnormal blood fat (lipid) levels. Heart-healthy meal planning includes limiting unhealthy fats, increasing healthy fats, and making other small dietary changes. This includes maintaining a healthy body weight to help keep lipid levels within a normal range.  WHAT IS MY PLAN?  Your health care provider recommends that you:  Drink a glass of water before meals to help with satiety.  Eat slowly.  An alternative to the water is to add Metamucil. This will help with satiety as well. It does contain calories, unlike water.  WHAT TYPES OF FAT SHOULD I CHOOSE?  Choose healthy fats more often. Choose monounsaturated and polyunsaturated fats, such as olive oil and canola oil, flaxseeds, walnuts, almonds, and seeds.  Eat more omega-3 fats. Good choices include salmon, mackerel, sardines, tuna, flaxseed oil, and ground flaxseeds. Aim to eat fish at least two times each week.  Avoid foods with partially hydrogenated oils in them. These contain trans fats. Examples of foods that  contain trans fats are stick margarine, some tub margarines, cookies, crackers, and other baked goods. If you are going to avoid a fat, this is the one to avoid!  WHAT GENERAL GUIDELINES DO I NEED TO FOLLOW?  Check food labels carefully to identify foods with trans fats. Avoid these types of options when possible.  Fill one half of your plate with vegetables and green salads. Eat 4-5 servings of vegetables per day. A serving of vegetables equals 1 cup of raw leafy vegetables,  cup of raw or cooked cut-up vegetables, or  cup of vegetable juice.  Fill one fourth of your plate with whole grains. Look for the word "whole" as the first word in the ingredient list.  Fill one fourth of your plate with lean protein foods.  Eat 4-5 servings of fruit per day. A serving of fruit equals one medium whole fruit,  cup of dried fruit,  cup of fresh, frozen, or canned fruit. Try to avoid fruits in cups/syrups as the sugar content can be high.  Eat more foods that contain soluble fiber. Examples of foods that contain this type of fiber are apples, broccoli, carrots, beans, peas, and barley. Aim to get 20-30 g of fiber per day.  Eat more home-cooked food and less restaurant, buffet, and fast food.  Limit or avoid alcohol.  Limit foods that are high in starch and sugar.  Avoid fried foods when able.  Cook foods by using methods other than frying. Baking, boiling, grilling, and broiling are all great options. Other fat-reducing suggestions include: ?  Removing the skin from poultry. ? Removing all visible fats from meats. ? Skimming the fat off of stews, soups, and gravies before serving them. ? Steaming vegetables in water or broth.  Lose weight if you are overweight. Losing just 5-10% of your initial body weight can help your overall health and prevent diseases such as diabetes and heart disease.  Increase your consumption of nuts, legumes, and seeds to 4-5 servings per week. One serving of dried  beans or legumes equals  cup after being cooked, one serving of nuts equals 1 ounces, and one serving of seeds equals  ounce or 1 tablespoon.  WHAT ARE GOOD FOODS CAN I EAT? Grains Grainy breads (try to find bread that is 3 g of fiber per slice or greater), oatmeal, light popcorn. Whole-grain cereals. Rice and pasta, including brown rice and those that are made with whole wheat. Edamame pasta is a great alternative to grain pasta. It has a higher protein content. Try to avoid significant consumption of white bread, sugary cereals, or pastries/baked goods.  Vegetables All vegetables. Cooked white potatoes do not count as vegetables.  Fruits All fruits, but limit pineapple and bananas as these fruits have a higher sugar content.  Meats and Other Protein Sources Lean, well-trimmed beef, veal, pork, and lamb. Chicken and [place] without skin. All fish and shellfish. Wild duck, rabbit, pheasant, and venison. Egg whites or low-cholesterol egg substitutes. Dried beans, peas, lentils, and tofu.Seeds and most nuts.  Dairy Low-fat or nonfat cheeses, including ricotta, string, and mozzarella. Skim or 1% milk that is liquid, powdered, or evaporated. Buttermilk that is made with low-fat milk. Nonfat or low-fat yogurt. Soy/Almond milk are good alternatives if you cannot handle dairy.  Beverages Water is the best for you. Sports drinks with less sugar are more desirable unless you are a highly active athlete.  Sweets and Desserts Sherbets and fruit ices. Honey, jam, marmalade, jelly, and syrups. Dark chocolate.  Eat all sweets and desserts in moderation.  Fats and Oils Nonhydrogenated (trans-free) margarines. Vegetable oils, including soybean, sesame, sunflower, olive, peanut, safflower, corn, canola, and cottonseed. Salad dressings or mayonnaise that are made with a vegetable oil. Limit added fats and oils that you use for cooking, baking, salads, and as spreads.  Other Cocoa powder. Coffee and  tea. Most condiments.  The items listed above may not be a complete list of recommended foods or beverages. Contact your dietitian for more options.   Claritin (loratadine), Allegra (fexofenadine), Zyrtec (cetirizine); these are listed in order from weakest to strongest. Generic, and therefore cheaper, options are in the parentheses.   Flonase (fluticasone); nasal spray that is over the counter. 2 sprays each nostril, once daily. Aim towards the same side eye when you spray.  There are available OTC, and the generic versions, which may be cheaper, are in parentheses. Show this to a pharmacist if you have trouble finding any of these items.

## 2016-10-02 ENCOUNTER — Ambulatory Visit (INDEPENDENT_AMBULATORY_CARE_PROVIDER_SITE_OTHER): Payer: BLUE CROSS/BLUE SHIELD | Admitting: Family Medicine

## 2016-10-02 ENCOUNTER — Encounter: Payer: Self-pay | Admitting: Family Medicine

## 2016-10-02 VITALS — BP 130/80 | HR 77 | Temp 98.1°F | Ht 71.0 in | Wt 228.4 lb

## 2016-10-02 DIAGNOSIS — I10 Essential (primary) hypertension: Secondary | ICD-10-CM

## 2016-10-02 DIAGNOSIS — E785 Hyperlipidemia, unspecified: Secondary | ICD-10-CM | POA: Diagnosis not present

## 2016-10-02 DIAGNOSIS — R7303 Prediabetes: Secondary | ICD-10-CM | POA: Diagnosis not present

## 2016-10-02 LAB — BASIC METABOLIC PANEL
BUN: 19 mg/dL (ref 6–23)
CHLORIDE: 106 meq/L (ref 96–112)
CO2: 28 meq/L (ref 19–32)
Calcium: 8.8 mg/dL (ref 8.4–10.5)
Creatinine, Ser: 1.33 mg/dL (ref 0.40–1.50)
GFR: 76.15 mL/min (ref >60.00)
GLUCOSE: 97 mg/dL (ref 70–99)
POTASSIUM: 3.9 meq/L (ref 3.5–5.1)
SODIUM: 140 meq/L (ref 135–145)

## 2016-10-02 MED ORDER — ATORVASTATIN CALCIUM 20 MG PO TABS: 20.0000 mg | ORAL_TABLET | Freq: Every day | ORAL | 1 refills | Status: DC

## 2016-10-02 MED ORDER — LISINOPRIL 10 MG PO TABS: 10.0000 mg | ORAL_TABLET | Freq: Every day | ORAL | 2 refills | Status: DC

## 2016-10-02 NOTE — Patient Instructions (Addendum)
Stop the [name].  Give us 2-3 business days to get the results of your labs back.   Aim to do some physical exertion for 150 minutes per week. This is typically divided into 5 days per week, 30 minutes per day. The activity should be enough to get your heart rate up. Anything is better than nothing if you have time constraints.  If there is a medicine you have that is not on today's AVS, do not take it.  Continue the blood pressure monitoring until next visit.

## 2016-10-02 NOTE — Progress Notes (Addendum)
Chief Complaint  Patient presents with  . Follow-up    on BP-pt states he has been having low BP-x 3 days-pt has BP readings    Subjective [name] is a 41 y.o. male who presents for hypertension follow up. He does monitor home blood pressures. Blood pressures ranging from 120-130's/70-80's on average. He is compliant with medications- [name] 20-25 mg daily. Patient has these side effects of medication: light-headed, dizzy, headache, general malaise He is starting to adhere to a healthy diet overall. Current exercise: none  Pt was also found to have LDL of 178. He used to be on cholesterol medicine in the past, does not remember exactly what it was called, but believes it could be Lipitor. His diet is improving and he does not exercise routinely. He is not known to have co-existing CAD.  Prediabetes- pt found to have A1c of 6.3. Diet and exercise as noted above. He denies personal hx of DM. His mother did have DM.   Past Medical History:  Diagnosis Date  . Allergy   . Asthma   . Asthma 09/14/2016  . GERD (gastroesophageal reflux disease)   . History of chicken pox   . Hyperlipidemia   . Hypertension    Family History  Problem Relation Age of Onset  . Hypertension Mother   . Diabetes Mother   . Heart attack Maternal Grandmother      Medications Current Outpatient Prescriptions on File Prior to Visit  Medication Sig Dispense Refill  . Albuterol Sulfate 108 (90 Base) MCG/ACT AEPB Inhale 1-2 puffs into the lungs every 6 (six) hours as needed (SOB, cough, wheezing). 1 each 2  . atorvastatin (LIPITOR) 20 MG tablet Take 1 tablet (20 mg total) by mouth daily. 90 tablet 3  . cetirizine (ZYRTEC ALLERGY) 10 MG tablet Take 1 tablet (10 mg total) by mouth daily. 30 tablet 5  . fluticasone (FLONASE) 50 MCG/ACT nasal spray Place 2 sprays into both nostrils daily. 16 g 6  . lisinopril-hydrochlorothiazide ([name],ZESTORETIC) 20-25 MG tablet Take 1 tablet by mouth daily. 30  tablet 2   Allergies Allergies  Allergen Reactions  . Penicillins   . Robaxin [Methocarbamol]   . Toradol [Ketorolac Tromethamine]     Review of Systems Cardiovascular: no chest pain Respiratory:  no shortness of breath  Exam BP 130/80 (BP Location: Left Arm, Patient Position: Sitting, Cuff Size: Large) Comment: Patient has not taking BP meds this am.  Pulse 77   Temp 98.1 F (36.7 C) (Oral)   Ht 5\' 11"  (1.803 m)   Wt 228 lb 6.4 oz (103.6 kg)   SpO2 99%   BMI 31.86 kg/m  General:  well developed, well nourished, in no apparent distress Skin:  warm, no pallor or diaphoresis Eyes:  pupils equal and round, sclera anicteric without injection Heart :RRR, no murmurs, no bruits, no LE edema Lungs:  clear to auscultation, no accessory muscle use Psych: well oriented with normal range of affect and appropriate judgment/insight  Essential hypertension - Plan: lisinopril (PRINIVIL,ZESTRIL) 10 MG tablet, Basic metabolic panel, Basic metabolic panel  Hyperlipidemia, unspecified hyperlipidemia type - Plan: atorvastatin (LIPITOR) 20 MG tablet  Prediabetes  Orders as above. Will decrease BP regimen to Lisinopril 10 mg daily. I imagine his BP will go up, but he will likely feel better if he is increased slowly. Cont monitoring BP at home. Start Lipitor. 10 yr CVD is 6.0%. Offered to start Metformin, declined at this time.  Counseled on diet and exercise F/u in  1 mo. The patient voiced understanding and agreement to the plan.  Jilda Roche [place], DO 10/02/16  8:46 AM

## 2016-10-07 ENCOUNTER — Encounter: Payer: Self-pay | Admitting: *Deleted

## 2016-10-14 ENCOUNTER — Ambulatory Visit: Payer: BLUE CROSS/BLUE SHIELD | Admitting: Family Medicine

## 2016-10-30 ENCOUNTER — Ambulatory Visit (INDEPENDENT_AMBULATORY_CARE_PROVIDER_SITE_OTHER): Payer: BLUE CROSS/BLUE SHIELD | Admitting: Family Medicine

## 2016-10-30 ENCOUNTER — Encounter: Payer: Self-pay | Admitting: Family Medicine

## 2016-10-30 VITALS — BP 144/90 | HR 71 | Temp 98.3°F | Ht 71.0 in | Wt 223.0 lb

## 2016-10-30 DIAGNOSIS — I10 Essential (primary) hypertension: Secondary | ICD-10-CM

## 2016-10-30 DIAGNOSIS — J4530 Mild persistent asthma, uncomplicated: Secondary | ICD-10-CM

## 2016-10-30 DIAGNOSIS — E785 Hyperlipidemia, unspecified: Secondary | ICD-10-CM | POA: Diagnosis not present

## 2016-10-30 DIAGNOSIS — Z23 Encounter for immunization: Secondary | ICD-10-CM | POA: Diagnosis not present

## 2016-10-30 MED ORDER — FLUTICASONE PROPIONATE HFA 110 MCG/ACT IN AERO: 2.0000 | INHALATION_SPRAY | Freq: Two times a day (BID) | RESPIRATORY_TRACT | 5 refills | Status: AC

## 2016-10-30 MED ORDER — LISINOPRIL 20 MG PO TABS: 20.0000 mg | ORAL_TABLET | Freq: Every day | ORAL | 2 refills | Status: DC

## 2016-10-30 NOTE — Progress Notes (Signed)
Chief Complaint  Patient presents with  . Follow-up    4 weeks on BP    Subjective [name] is a 41 y.o. male who presents for hypertension follow up.  He does monitor home blood pressures. Blood pressures ranging from 140's/90's on average. He is compliant with medications- [name] 10 mg daily- he was started on [name] 20-25 mg daily with good BP control, but he was having side effects of dizziness, HA's, and dizziness/lightheadedness. Patient has these side effects of medication: None! Has been having a dry cough for 4 mo He is adhering to a healthy diet overall. Current exercise: active and sweating a lot at work  Dyslipidemia Patient presents for dyslipidemia follow up. Compliance with treatment thus far has been good- [name] 20 mg daily. He denies myalgias. He is not adhering to a low sodium and low fat diet. The patient exercises never.  The patient is not known to have coexisting coronary artery disease.  Cough He has had a cough for 4 mo (started before ACEi rx'd). At first it started with a URI people were passing to each other at work. Everyone's has resolved except his. He will get 3-4 coughing spells daily. He will take some [name] as he is an asthmatic that is helpful. No SOB outside of coughing spells. He is not on any other inhalers. He has been outside in the heat and humidity lately.   Past Medical History:  Diagnosis Date  . Allergy   . Asthma   . Asthma 09/14/2016  . GERD (gastroesophageal reflux disease)   . History of chicken pox   . Hyperlipidemia   . Hypertension    Family History  Problem Relation Age of Onset  . Hypertension Mother   . Diabetes Mother   . Heart attack Maternal Grandmother      Medications Current Outpatient Prescriptions on File Prior to Visit  Medication Sig Dispense Refill  . [name] Sulfate 108 (90 Base) MCG/ACT AEPB Inhale 1-2 puffs into the lungs every 6 (six) hours as needed (SOB, cough, wheezing). 1  each 2  . atorvastatin ([name]) 20 MG tablet Take 1 tablet (20 mg total) by mouth daily. 90 tablet 1  . cetirizine (ZYRTEC ALLERGY) 10 MG tablet Take 1 tablet (10 mg total) by mouth daily. 30 tablet 5  . fluticasone (FLONASE) 50 MCG/ACT nasal spray Place 2 sprays into both nostrils daily. 16 g 6  . lisinopril (PRINIVIL,ZESTRIL) 10 MG tablet Take 1 tablet (10 mg total) by mouth daily. 30 tablet 2   Allergies Allergies  Allergen Reactions  . Penicillins   . Robaxin [Methocarbamol]   . Toradol [Ketorolac Tromethamine]     Review of Systems Cardiovascular: no chest pain Respiratory:  no shortness of breath, +cough  Exam BP (!) 144/90 (BP Location: Left Arm, Patient Position: Sitting, Cuff Size: Large)   Pulse 71   Temp 98.3 F (36.8 C) (Oral)   Ht 5\' 11"  (1.803 m)   Wt 223 lb (101.2 kg)   SpO2 98%   BMI 31.10 kg/m  General:  well developed, well nourished, in no apparent distress HEENT: Ears neg b/l, nares patent, L turb hypertrophy with clear rhinorrhea noted, MMM, no pharyngeal exudate or erythema Skin:  warm, no pallor or diaphoresis Eyes:  pupils equal and round, sclera anicteric without injection Heart :RRR, no murmurs, no bruits, no LE edema Lungs:  clear to auscultation, no accessory muscle use Psych: well oriented with normal range of affect and appropriate judgment/insight  Essential  hypertension - Plan: lisinopril (PRINIVIL,ZESTRIL) 20 MG tablet  Mild persistent asthma without complication - Plan: fluticasone (FLOVENT HFA) 110 MCG/ACT inhaler  Hyperlipidemia, unspecified hyperlipidemia type  Orders as above. Increase dose of lisinopril to 20 mg daily, now tolerating well.  Cont [name]. Start Flovent for asthma control. Counseled on diet and exercise F/u in 1 mo. The patient voiced understanding and agreement to the plan.  Jilda Rocheicholas Paul [place], DO 10/30/16  7:46 AM

## 2016-10-30 NOTE — Patient Instructions (Addendum)
Continue to check home BP's intermittently. Take 2 tabs of your lisinopril until you run out, then fill the new dosage (20 mg tabs). Continue eating cleanly.  Remember to rinse your mouth out after use of the new inhaler (Flovent).  Start daily use of your Flonase to help with allergies/congestion.  Let us know if you need anything.

## 2016-11-27 ENCOUNTER — Ambulatory Visit: Payer: BLUE CROSS/BLUE SHIELD | Admitting: Family Medicine

## 2016-11-27 ENCOUNTER — Telehealth: Payer: Self-pay | Admitting: Family Medicine

## 2016-11-27 NOTE — Telephone Encounter (Signed)
No charge. 

## 2016-11-27 NOTE — Telephone Encounter (Signed)
Patient lvm at 9:24am cancelling today 7am appointment due to work conflict, charge or no charge

## 2016-12-02 ENCOUNTER — Encounter: Payer: Self-pay | Admitting: Family Medicine

## 2016-12-02 ENCOUNTER — Ambulatory Visit (INDEPENDENT_AMBULATORY_CARE_PROVIDER_SITE_OTHER): Payer: BLUE CROSS/BLUE SHIELD | Admitting: Family Medicine

## 2016-12-02 VITALS — BP 134/82 | HR 79 | Temp 98.3°F | Ht 71.0 in | Wt 225.2 lb

## 2016-12-02 DIAGNOSIS — R202 Paresthesia of skin: Secondary | ICD-10-CM | POA: Diagnosis not present

## 2016-12-02 DIAGNOSIS — I10 Essential (primary) hypertension: Secondary | ICD-10-CM | POA: Diagnosis not present

## 2016-12-02 DIAGNOSIS — Z87828 Personal history of other (healed) physical injury and trauma: Secondary | ICD-10-CM | POA: Insufficient documentation

## 2016-12-02 NOTE — Patient Instructions (Signed)
OK to check BP 1-2 times per week, 1-2 times per day. If you wish to check it more often, OK to do.   If you change your mind about the numbness in your hands, we can get labs, start medication or try some wrist splints. If things worsen or new symptoms arise, return to clinic.

## 2016-12-02 NOTE — Progress Notes (Signed)
Chief Complaint  Patient presents with  . Follow-up    4 week on BP    Subjective [name] is a 41 y.o. male who presents for hypertension follow up. He does monitor home blood pressures. Blood pressures ranging from 120-130's/70's on average. He is compliant with medications- lisinopril 20 mg daily, recently increased from 10 mg daily- I started him on [name] 20-25 mg daily initially but he was having side effects. Patient has these side effects of medication: none He is adhering to a healthy diet overall, it is getting better though. Current exercise: none; physically active at work   Past Medical History:  Diagnosis Date  . Allergy   . Asthma   . Asthma 09/14/2016  . GERD (gastroesophageal reflux disease)   . History of chicken pox   . Hyperlipidemia   . Hypertension    Family History  Problem Relation Age of Onset  . Hypertension Mother   . Diabetes Mother   . Heart attack Maternal Grandmother      Medications Current Outpatient Prescriptions on File Prior to Visit  Medication Sig Dispense Refill  . Albuterol Sulfate 108 (90 Base) MCG/ACT AEPB Inhale 1-2 puffs into the lungs every 6 (six) hours as needed (SOB, cough, wheezing). 1 each 2  . atorvastatin (LIPITOR) 20 MG tablet Take 1 tablet (20 mg total) by mouth daily. 90 tablet 1  . fluticasone (FLONASE) 50 MCG/ACT nasal spray Place 2 sprays into both nostrils daily. 16 g 6  . fluticasone (FLOVENT HFA) 110 MCG/ACT inhaler Inhale 2 puffs into the lungs 2 (two) times daily. 1 Inhaler 5  . lisinopril (PRINIVIL,ZESTRIL) 20 MG tablet Take 1 tablet (20 mg total) by mouth daily. 30 tablet 2   Allergies Allergies  Allergen Reactions  . Penicillins   . Robaxin [Methocarbamol]   . Toradol [Ketorolac Tromethamine]     Review of Systems Cardiovascular: no chest pain Respiratory:  no shortness of breath  Exam BP 134/82 (BP Location: Left Arm, Patient Position: Sitting, Cuff Size: Large)   Pulse 79   Temp 98.3  F (36.8 C) (Oral)   Ht 5\' 11"  (1.803 m)   Wt 225 lb 3.2 oz (102.2 kg)   SpO2 99%   BMI 31.41 kg/m  General:  well developed, well nourished, in no apparent distress Skin:  warm, no pallor or diaphoresis Eyes:  pupils equal and round, sclera anicteric without injection Heart :RRR, no murmurs, no bruits, no LE edema Lungs:  clear to auscultation, no accessory muscle use Neuro: Sensation intact to light touch on hands and face b/l; no cerebellar signs, CN2-12 intact grossly MSK: Gait normal, 5/5 strength throughout Psych: well oriented with normal range of affect and appropriate judgment/insight  Essential hypertension  Paresthesias  Orders as above. BP now controlled. Will back off on the BP home check frequency. 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 The patient does not seem particularly concerned with his paresthesias. Offered lab testing, medication initiation, or splinting. He declined all of these at this time and wishes to watchfully wait.   Counseled on diet and exercise F/u in 3 mo. I will see him every 6 months if he is well-controlled that visit. The patient voiced understanding and agreement to the plan.  Jilda Rocheicholas Paul [place], DO 12/02/16  7:59 AM

## 2016-12-05 ENCOUNTER — Emergency Department (HOSPITAL_BASED_OUTPATIENT_CLINIC_OR_DEPARTMENT_OTHER)
Admission: EM | Admit: 2016-12-05 | Discharge: 2016-12-05 | Disposition: A | Discharge status: Home / Self Care | Payer: BLUE CROSS/BLUE SHIELD | Attending: Emergency Medicine | Admitting: Emergency Medicine

## 2016-12-05 ENCOUNTER — Emergency Department (HOSPITAL_BASED_OUTPATIENT_CLINIC_OR_DEPARTMENT_OTHER): Payer: BLUE CROSS/BLUE SHIELD

## 2016-12-05 ENCOUNTER — Encounter (HOSPITAL_BASED_OUTPATIENT_CLINIC_OR_DEPARTMENT_OTHER): Payer: Self-pay | Admitting: *Deleted

## 2016-12-05 DIAGNOSIS — R200 Anesthesia of skin: Secondary | ICD-10-CM | POA: Diagnosis not present

## 2016-12-05 DIAGNOSIS — I10 Essential (primary) hypertension: Secondary | ICD-10-CM | POA: Diagnosis not present

## 2016-12-05 DIAGNOSIS — Z87891 Personal history of nicotine dependence: Secondary | ICD-10-CM | POA: Diagnosis not present

## 2016-12-05 DIAGNOSIS — Z79899 Other long term (current) drug therapy: Secondary | ICD-10-CM | POA: Insufficient documentation

## 2016-12-05 DIAGNOSIS — R202 Paresthesia of skin: Secondary | ICD-10-CM | POA: Diagnosis not present

## 2016-12-05 DIAGNOSIS — J45909 Unspecified asthma, uncomplicated: Secondary | ICD-10-CM | POA: Insufficient documentation

## 2016-12-05 LAB — BASIC METABOLIC PANEL
ANION GAP: 13 (ref 5–15)
BUN: UNDETERMINED mg/dL (ref 6–20)
CHLORIDE: 104 mmol/L (ref 101–111)
CO2: 20 mmol/L — AB (ref 22–32)
Calcium: 9 mg/dL (ref 8.9–10.3)
Creatinine, Ser: UNDETERMINED mg/dL (ref 0.61–1.24)
GLUCOSE: 113 mg/dL — AB (ref 65–99)
POTASSIUM: 3.7 mmol/L (ref 3.5–5.1)
SODIUM: 137 mmol/L (ref 135–145)

## 2016-12-05 LAB — CBC WITH DIFFERENTIAL/PLATELET
BASOS ABS: 0 10*3/uL (ref 0.0–0.1)
Basophils Relative: 0 %
EOS ABS: 0.3 10*3/uL (ref 0.0–0.7)
Eosinophils Relative: 4 %
HCT: 42.4 % (ref 39.0–52.0)
HEMOGLOBIN: 14 g/dL (ref 13.0–17.0)
LYMPHS ABS: 2.6 10*3/uL (ref 0.7–4.0)
Lymphocytes Relative: 34 %
MCH: 27 pg (ref 26.0–34.0)
MCHC: 33 g/dL (ref 30.0–36.0)
MCV: 81.9 fL (ref 78.0–100.0)
Monocytes Absolute: 0.8 10*3/uL (ref 0.1–1.0)
Monocytes Relative: 10 %
NEUTROS PCT: 52 %
Neutro Abs: 4 10*3/uL (ref 1.7–7.7)
Platelets: 199 10*3/uL (ref 150–400)
RBC: 5.18 MIL/uL (ref 4.22–5.81)
RDW: 14.1 % (ref 11.5–15.5)
WBC: 7.6 10*3/uL (ref 4.0–10.5)

## 2016-12-05 NOTE — Discharge Instructions (Signed)
Please follow-up with one of the neurology offices below for further evaluation and treatment of your symptoms. Please return to emergency department if you develop any new or worsening symptoms including complete numbness or inability to move your arm, slurred speech, inability to walk, or any other new or concerning symptoms.

## 2016-12-05 NOTE — ED Triage Notes (Signed)
Pt c/o right arm numbness and weakness x 24 hrs ago
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
## 2016-12-26 ENCOUNTER — Encounter (HOSPITAL_COMMUNITY): Payer: Self-pay | Admitting: Emergency Medicine

## 2016-12-26 ENCOUNTER — Other Ambulatory Visit: Payer: Self-pay

## 2016-12-26 ENCOUNTER — Emergency Department (HOSPITAL_COMMUNITY): Payer: BLUE CROSS/BLUE SHIELD

## 2016-12-26 ENCOUNTER — Inpatient Hospital Stay (HOSPITAL_COMMUNITY)
Admission: EM | Admit: 2016-12-26 | Discharge: 2016-12-30 | DRG: 065 | Disposition: A | Discharge status: Home / Self Care | Payer: BLUE CROSS/BLUE SHIELD | Attending: Internal Medicine | Admitting: Internal Medicine

## 2016-12-26 DIAGNOSIS — Z6831 Body mass index (BMI) 31.0-31.9, adult: Secondary | ICD-10-CM | POA: Diagnosis not present

## 2016-12-26 DIAGNOSIS — J45909 Unspecified asthma, uncomplicated: Secondary | ICD-10-CM | POA: Diagnosis present

## 2016-12-26 DIAGNOSIS — R200 Anesthesia of skin: Secondary | ICD-10-CM | POA: Diagnosis present

## 2016-12-26 DIAGNOSIS — Z888 Allergy status to other drugs, medicaments and biological substances status: Secondary | ICD-10-CM

## 2016-12-26 DIAGNOSIS — E876 Hypokalemia: Secondary | ICD-10-CM | POA: Diagnosis present

## 2016-12-26 DIAGNOSIS — I119 Hypertensive heart disease without heart failure: Secondary | ICD-10-CM | POA: Diagnosis present

## 2016-12-26 DIAGNOSIS — I63011 Cerebral infarction due to thrombosis of right vertebral artery: Secondary | ICD-10-CM

## 2016-12-26 DIAGNOSIS — R471 Dysarthria and anarthria: Secondary | ICD-10-CM | POA: Diagnosis present

## 2016-12-26 DIAGNOSIS — I82431 Acute embolism and thrombosis of right popliteal vein: Secondary | ICD-10-CM | POA: Diagnosis present

## 2016-12-26 DIAGNOSIS — E785 Hyperlipidemia, unspecified: Secondary | ICD-10-CM | POA: Diagnosis present

## 2016-12-26 DIAGNOSIS — I63412 Cerebral infarction due to embolism of left middle cerebral artery: Secondary | ICD-10-CM | POA: Diagnosis present

## 2016-12-26 DIAGNOSIS — F17200 Nicotine dependence, unspecified, uncomplicated: Secondary | ICD-10-CM | POA: Diagnosis not present

## 2016-12-26 DIAGNOSIS — Z79899 Other long term (current) drug therapy: Secondary | ICD-10-CM | POA: Diagnosis not present

## 2016-12-26 DIAGNOSIS — R002 Palpitations: Secondary | ICD-10-CM

## 2016-12-26 DIAGNOSIS — R000 Tachycardia, unspecified: Secondary | ICD-10-CM | POA: Diagnosis present

## 2016-12-26 DIAGNOSIS — E669 Obesity, unspecified: Secondary | ICD-10-CM | POA: Diagnosis present

## 2016-12-26 DIAGNOSIS — G8321 Monoplegia of upper limb affecting right dominant side: Secondary | ICD-10-CM | POA: Diagnosis present

## 2016-12-26 DIAGNOSIS — R29707 NIHSS score 7: Secondary | ICD-10-CM | POA: Diagnosis present

## 2016-12-26 DIAGNOSIS — Z86718 Personal history of other venous thrombosis and embolism: Secondary | ICD-10-CM | POA: Diagnosis not present

## 2016-12-26 DIAGNOSIS — I6502 Occlusion and stenosis of left vertebral artery: Secondary | ICD-10-CM | POA: Diagnosis present

## 2016-12-26 DIAGNOSIS — I639 Cerebral infarction, unspecified: Secondary | ICD-10-CM | POA: Diagnosis present

## 2016-12-26 DIAGNOSIS — Z87891 Personal history of nicotine dependence: Secondary | ICD-10-CM

## 2016-12-26 DIAGNOSIS — Z7982 Long term (current) use of aspirin: Secondary | ICD-10-CM | POA: Diagnosis not present

## 2016-12-26 DIAGNOSIS — R7303 Prediabetes: Secondary | ICD-10-CM | POA: Diagnosis present

## 2016-12-26 DIAGNOSIS — Z880 Allergy status to penicillin: Secondary | ICD-10-CM

## 2016-12-26 DIAGNOSIS — R739 Hyperglycemia, unspecified: Secondary | ICD-10-CM

## 2016-12-26 DIAGNOSIS — I6602 Occlusion and stenosis of left middle cerebral artery: Secondary | ICD-10-CM

## 2016-12-26 DIAGNOSIS — I10 Essential (primary) hypertension: Secondary | ICD-10-CM | POA: Diagnosis present

## 2016-12-26 DIAGNOSIS — I638 Other cerebral infarction: Secondary | ICD-10-CM | POA: Diagnosis not present

## 2016-12-26 DIAGNOSIS — I82491 Acute embolism and thrombosis of other specified deep vein of right lower extremity: Secondary | ICD-10-CM | POA: Diagnosis present

## 2016-12-26 LAB — COMPREHENSIVE METABOLIC PANEL
ALT: 27 U/L (ref 17–63)
AST: 24 U/L (ref 15–41)
Albumin: 4.1 g/dL (ref 3.5–5.0)
Alkaline Phosphatase: 85 U/L (ref 38–126)
Anion gap: 9 (ref 5–15)
BILIRUBIN TOTAL: 0.5 mg/dL (ref 0.3–1.2)
BUN: 8 mg/dL (ref 6–20)
CO2: 24 mmol/L (ref 22–32)
CREATININE: 1.29 mg/dL — AB (ref 0.61–1.24)
Calcium: 9.1 mg/dL (ref 8.9–10.3)
Chloride: 105 mmol/L (ref 101–111)
Glucose, Bld: 107 mg/dL — ABNORMAL HIGH (ref 65–99)
POTASSIUM: 3.6 mmol/L (ref 3.5–5.1)
Sodium: 138 mmol/L (ref 135–145)
TOTAL PROTEIN: 7.3 g/dL (ref 6.5–8.1)

## 2016-12-26 LAB — I-STAT TROPONIN, ED: TROPONIN I, POC: 0 ng/mL (ref 0.00–0.08)

## 2016-12-26 LAB — I-STAT CHEM 8, ED
BUN: 12 mg/dL (ref 6–20)
CALCIUM ION: 1.02 mmol/L — AB (ref 1.15–1.40)
CREATININE: 1.2 mg/dL (ref 0.61–1.24)
Chloride: 104 mmol/L (ref 101–111)
GLUCOSE: 106 mg/dL — AB (ref 65–99)
HCT: 44 % (ref 39.0–52.0)
HEMOGLOBIN: 15 g/dL (ref 13.0–17.0)
POTASSIUM: 3.7 mmol/L (ref 3.5–5.1)
Sodium: 140 mmol/L (ref 135–145)
TCO2: 24 mmol/L (ref 22–32)

## 2016-12-26 LAB — DIFFERENTIAL
BASOS ABS: 0 10*3/uL (ref 0.0–0.1)
Basophils Relative: 0 %
EOS ABS: 0.2 10*3/uL (ref 0.0–0.7)
Eosinophils Relative: 2 %
LYMPHS ABS: 2.4 10*3/uL (ref 0.7–4.0)
Lymphocytes Relative: 25 %
MONO ABS: 0.8 10*3/uL (ref 0.1–1.0)
MONOS PCT: 8 %
Neutro Abs: 6.1 10*3/uL (ref 1.7–7.7)
Neutrophils Relative %: 65 %

## 2016-12-26 LAB — CBC
HEMATOCRIT: 41.1 % (ref 39.0–52.0)
HEMOGLOBIN: 13.7 g/dL (ref 13.0–17.0)
MCH: 27 pg (ref 26.0–34.0)
MCHC: 33.3 g/dL (ref 30.0–36.0)
MCV: 80.9 fL (ref 78.0–100.0)
Platelets: 250 10*3/uL (ref 150–400)
RBC: 5.08 MIL/uL (ref 4.22–5.81)
RDW: 13.5 % (ref 11.5–15.5)
WBC: 9.5 10*3/uL (ref 4.0–10.5)

## 2016-12-26 LAB — ETHANOL

## 2016-12-26 LAB — APTT: APTT: 29 s (ref 24–36)

## 2016-12-26 LAB — PROTIME-INR
INR: 0.98
Prothrombin Time: 12.9 seconds (ref 11.4–15.2)

## 2016-12-26 MED ORDER — ALBUTEROL SULFATE (2.5 MG/3ML) 0.083% IN NEBU: 3.0000 mL | INHALATION_SOLUTION | Freq: Four times a day (QID) | RESPIRATORY_TRACT | Status: DC | PRN

## 2016-12-26 MED ORDER — ACETAMINOPHEN 650 MG RE SUPP: 650.0000 mg | RECTAL | Status: DC | PRN

## 2016-12-26 MED ORDER — ASPIRIN 325 MG PO TABS: 325.0000 mg | ORAL_TABLET | Freq: Once | ORAL | Status: AC

## 2016-12-26 MED ORDER — CLOPIDOGREL BISULFATE 75 MG PO TABS
75.0000 mg | ORAL_TABLET | Freq: Every day | ORAL | Status: DC
  Administered 2016-12-27 – 2016-12-28 (×3): 75 mg via ORAL
  Filled 2016-12-26 (×4): qty 1

## 2016-12-26 MED ORDER — IOPAMIDOL (ISOVUE-370) INJECTION 76%
INTRAVENOUS | Status: AC
  Filled 2016-12-26: qty 50

## 2016-12-26 MED ORDER — STROKE: EARLY STAGES OF RECOVERY BOOK
Freq: Once | Status: AC
  Administered 2016-12-27

## 2016-12-26 MED ORDER — IOPAMIDOL (ISOVUE-370) INJECTION 76%
INTRAVENOUS | Status: AC
  Administered 2016-12-26: 20:00:00
  Filled 2016-12-26: qty 100

## 2016-12-26 MED ORDER — ASPIRIN 81 MG PO CHEW
81.0000 mg | CHEWABLE_TABLET | Freq: Every day | ORAL | Status: DC
  Administered 2016-12-27: 81 mg via ORAL
  Filled 2016-12-26: qty 1

## 2016-12-26 MED ORDER — SODIUM CHLORIDE 0.9 % IV SOLN
INTRAVENOUS | Status: AC
  Administered 2016-12-27: via INTRAVENOUS

## 2016-12-26 MED ORDER — ACETAMINOPHEN 325 MG PO TABS
650.0000 mg | ORAL_TABLET | ORAL | Status: DC | PRN
  Administered 2016-12-27 – 2016-12-29 (×2): 650 mg via ORAL
  Filled 2016-12-26 (×2): qty 2

## 2016-12-26 MED ORDER — FLUTICASONE PROPIONATE 50 MCG/ACT NA SUSP
2.0000 | Freq: Every day | NASAL | Status: DC
  Filled 2016-12-26: qty 16

## 2016-12-26 MED ORDER — ASPIRIN 81 MG PO CHEW
324.0000 mg | CHEWABLE_TABLET | ORAL | Status: AC
  Administered 2016-12-26: 324 mg via ORAL
  Filled 2016-12-26: qty 4

## 2016-12-26 MED ORDER — ATORVASTATIN CALCIUM 80 MG PO TABS
80.0000 mg | ORAL_TABLET | Freq: Every day | ORAL | Status: DC
  Administered 2016-12-27 – 2016-12-29 (×3): 80 mg via ORAL
  Filled 2016-12-26 (×3): qty 1

## 2016-12-26 MED ORDER — ACETAMINOPHEN 160 MG/5ML PO SOLN: 650.0000 mg | ORAL | Status: DC | PRN

## 2016-12-26 NOTE — ED Notes (Signed)
Pt presents to ED for assessment after having right sided weakness, garbled speech, facial droop starting at 1800 tonight.  Pt states he has had intermittent numbness and weakness all week, but this is the worst it has been.  Dr. Hyacinth Meeker saw patient and cleared for CT, taken to scanner and handoff given to primary RN

## 2016-12-26 NOTE — ED Notes (Signed)
[facility] contacted to activate Code Stroke 
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
## 2016-12-26 NOTE — ED Notes (Signed)
Patient transported to CT 

## 2016-12-26 NOTE — ED Notes (Signed)
Pt currently in CT, edp Miller starting IV.
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
## 2016-12-27 ENCOUNTER — Encounter (HOSPITAL_COMMUNITY): Payer: BLUE CROSS/BLUE SHIELD

## 2016-12-27 ENCOUNTER — Inpatient Hospital Stay (HOSPITAL_COMMUNITY): Payer: BLUE CROSS/BLUE SHIELD

## 2016-12-27 HISTORY — PX: IR ANGIO VERTEBRAL SEL VERTEBRAL BILAT MOD SED: IMG5369

## 2016-12-27 HISTORY — PX: IR ANGIO INTRA EXTRACRAN SEL COM CAROTID INNOMINATE BILAT MOD SED: IMG5360

## 2016-12-27 LAB — LIPID PANEL
Cholesterol: 211 mg/dL — ABNORMAL HIGH (ref 0–200)
HDL: 36 mg/dL — AB (ref >40)
LDL CALC: 163 mg/dL — AB (ref 0–99)
TRIGLYCERIDES: 58 mg/dL (ref <150)
Total CHOL/HDL Ratio: 5.9 RATIO
VLDL: 12 mg/dL (ref 0–40)

## 2016-12-27 LAB — RAPID URINE DRUG SCREEN, HOSP PERFORMED
Amphetamines: NOT DETECTED
BARBITURATES: NOT DETECTED
Benzodiazepines: NOT DETECTED
Cocaine: NOT DETECTED
OPIATES: NOT DETECTED
TETRAHYDROCANNABINOL: NOT DETECTED

## 2016-12-27 LAB — COMPREHENSIVE METABOLIC PANEL
ALT: 24 U/L (ref 17–63)
ANION GAP: 8 (ref 5–15)
AST: 21 U/L (ref 15–41)
Albumin: 3.5 g/dL (ref 3.5–5.0)
Alkaline Phosphatase: 76 U/L (ref 38–126)
BUN: 9 mg/dL (ref 6–20)
CHLORIDE: 105 mmol/L (ref 101–111)
CO2: 26 mmol/L (ref 22–32)
CREATININE: 1.2 mg/dL (ref 0.61–1.24)
Calcium: 8.8 mg/dL — ABNORMAL LOW (ref 8.9–10.3)
Glucose, Bld: 127 mg/dL — ABNORMAL HIGH (ref 65–99)
POTASSIUM: 3.4 mmol/L — AB (ref 3.5–5.1)
SODIUM: 139 mmol/L (ref 135–145)
Total Bilirubin: 0.7 mg/dL (ref 0.3–1.2)
Total Protein: 6.3 g/dL — ABNORMAL LOW (ref 6.5–8.1)

## 2016-12-27 LAB — CK: Total CK: 235 U/L (ref 49–397)

## 2016-12-27 LAB — SEDIMENTATION RATE
Sed Rate: 5 mm/hr (ref 0–16)
Sed Rate: 10 mm/hr (ref 0–16)

## 2016-12-27 LAB — ANTITHROMBIN III: AntiThromb III Func: 98 % (ref 75–120)

## 2016-12-27 LAB — CBC
HEMATOCRIT: 37.7 % — AB (ref 39.0–52.0)
HEMOGLOBIN: 12.6 g/dL — AB (ref 13.0–17.0)
MCH: 27 pg (ref 26.0–34.0)
MCHC: 33.4 g/dL (ref 30.0–36.0)
MCV: 80.9 fL (ref 78.0–100.0)
Platelets: 249 10*3/uL (ref 150–400)
RBC: 4.66 MIL/uL (ref 4.22–5.81)
RDW: 13.7 % (ref 11.5–15.5)
WBC: 7.4 10*3/uL (ref 4.0–10.5)

## 2016-12-27 LAB — URINALYSIS, ROUTINE W REFLEX MICROSCOPIC
Bilirubin Urine: NEGATIVE
Glucose, UA: NEGATIVE mg/dL
Hgb urine dipstick: NEGATIVE
Ketones, ur: NEGATIVE mg/dL
LEUKOCYTES UA: NEGATIVE
NITRITE: NEGATIVE
Protein, ur: NEGATIVE mg/dL
Specific Gravity, Urine: 1.03 (ref 1.005–1.030)
pH: 6 (ref 5.0–8.0)

## 2016-12-27 LAB — VITAMIN B12: Vitamin B-12: 470 pg/mL (ref 180–914)

## 2016-12-27 LAB — HEMOGLOBIN A1C
HEMOGLOBIN A1C: 5.9 % — AB (ref 4.8–5.6)
Mean Plasma Glucose: 122.63 mg/dL

## 2016-12-27 LAB — TSH: TSH: 2.349 u[IU]/mL (ref 0.350–4.500)

## 2016-12-27 LAB — HIV ANTIBODY (ROUTINE TESTING W REFLEX): HIV Screen 4th Generation wRfx: NONREACTIVE

## 2016-12-27 LAB — C-REACTIVE PROTEIN

## 2016-12-27 MED ORDER — HEPARIN SOD (PORK) LOCK FLUSH 100 UNIT/ML IV SOLN
INTRAVENOUS | Status: AC
  Filled 2016-12-27: qty 25

## 2016-12-27 MED ORDER — HYDRALAZINE HCL 20 MG/ML IJ SOLN
INTRAMUSCULAR | Status: AC
  Filled 2016-12-27: qty 2

## 2016-12-27 MED ORDER — IOPAMIDOL (ISOVUE-300) INJECTION 61%
INTRAVENOUS | Status: AC
  Filled 2016-12-27: qty 150

## 2016-12-27 MED ORDER — MIDAZOLAM HCL 2 MG/2ML IJ SOLN
INTRAMUSCULAR | Status: AC
  Filled 2016-12-27: qty 4

## 2016-12-27 MED ORDER — LIDOCAINE HCL (PF) 1 % IJ SOLN
INTRAMUSCULAR | Status: AC
  Filled 2016-12-27: qty 30

## 2016-12-27 MED ORDER — HEPARIN SOD (PORK) LOCK FLUSH 100 UNIT/ML IV SOLN
INTRAVENOUS | Status: AC | PRN
  Administered 2016-12-27: 500 [IU] via INTRAVENOUS
  Administered 2016-12-27: 1000 [IU] via INTRAVENOUS

## 2016-12-27 MED ORDER — IOPAMIDOL (ISOVUE-300) INJECTION 61%
INTRAVENOUS | Status: AC
  Administered 2016-12-27: 75 mL
  Filled 2016-12-27: qty 50

## 2016-12-27 MED ORDER — SODIUM CHLORIDE 0.9 % IV SOLN: INTRAVENOUS | Status: AC

## 2016-12-27 MED ORDER — FENTANYL CITRATE (PF) 100 MCG/2ML IJ SOLN
INTRAMUSCULAR | Status: AC | PRN
  Administered 2016-12-27 (×3): 25 ug via INTRAVENOUS

## 2016-12-27 MED ORDER — FENTANYL CITRATE (PF) 100 MCG/2ML IJ SOLN
INTRAMUSCULAR | Status: AC
  Filled 2016-12-27: qty 4

## 2016-12-27 MED ORDER — HYDRALAZINE HCL 20 MG/ML IJ SOLN: 5.0000 mg | Freq: Four times a day (QID) | INTRAMUSCULAR | Status: DC | PRN

## 2016-12-27 MED ORDER — LIDOCAINE HCL (PF) 1 % IJ SOLN
INTRAMUSCULAR | Status: AC | PRN
  Administered 2016-12-27: 15 mL

## 2016-12-27 MED ORDER — POTASSIUM CHLORIDE CRYS ER 20 MEQ PO TBCR
20.0000 meq | EXTENDED_RELEASE_TABLET | Freq: Once | ORAL | Status: AC
  Administered 2016-12-27: 20 meq via ORAL
  Filled 2016-12-27: qty 1

## 2016-12-27 MED ORDER — HYDRALAZINE HCL 20 MG/ML IJ SOLN
INTRAMUSCULAR | Status: AC | PRN
  Administered 2016-12-27 (×2): 5 mg via INTRAVENOUS

## 2016-12-27 MED ORDER — MIDAZOLAM HCL 2 MG/2ML IJ SOLN
INTRAMUSCULAR | Status: AC | PRN
  Administered 2016-12-27: 1 mg via INTRAVENOUS

## 2016-12-27 NOTE — Sedation Documentation (Signed)
Patient denies pain and is resting comfortably.  

## 2016-12-27 NOTE — Progress Notes (Signed)
PT Cancellation Note  Patient Details Name: [name] MRN: [id number] DOB: 1976-04-07   Cancelled Treatment:    Reason Eval/Treat Not Completed: Patient at procedure or test/unavailable   Fabio Asa 12/27/2016, 10:15 AM Charlotte Crumb, PT DPT  Board Certified Neurologic Specialist [phone]

## 2016-12-27 NOTE — Progress Notes (Signed)
VASCULAR LAB    Patient had normal CTA of neck.  Please advise if patient still needs carotid duplex.    Thank you,   Bartt Gonzaga, RVT 12/27/2016, 9:59 AM
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
## 2016-12-27 NOTE — Code Documentation (Signed)
Responded to code stroke.  Pt presents with right sided weakness, right arm numbness and decreased sensation, facial droop and dysarthria. Initial NIHSS of 7.  Not a candidate for TPA as symptoms have been occurring for > 24 hours per patient.  CT head shows small cortical infarct in the left precentral gyrus.  CTA and CTP performed.  Severe left M1 segment stenosis present.  Pt being admitted for further workup.

## 2016-12-27 NOTE — Progress Notes (Signed)
OT Cancellation Note  Patient Details Name: ESTEFAN GILLELAND MRN: [id number] DOB: 05-Oct-1975   Cancelled Treatment:    Reason Eval/Treat Not Completed: Medical issues which prohibited therapy. Pt on bedrest until 1500. OT will follow for evaluation.  Evern Bio Huberta Tompkins 12/27/2016, 1:50 PM  Sherryl Manges OTR/L [phone]

## 2016-12-27 NOTE — Progress Notes (Signed)
PROGRESS NOTE    [name]  WUJ:[id number] DOB: November 06, 1975 DOA: 12/26/2016 PCP: Timothy Dory, DO (Confirm with patient/family/NH records and if not entered, this HAS to be entered at [facility] point of entry. "No PCP" if truly none.)   Brief Narrative: (Start on day 1 of progress note - keep it brief and live) 41 yo M with HTN, HLD presenting with R sided weakness, facial droop and trouble speaking with imaging findings concerning for stroke.    Assessment & Plan:   Active Problems:   Essential hypertension   Hyperlipidemia   Prediabetes   Acute ischemic stroke (HCC)   Hyperglycemia   Stroke [facility])   Stroke:   CTA with severe L M1 stenosis.  Small cortical infarct in the L precentral gyrus seen on CT head w/o contrast from 8/25.   Telemetry, sinus in 70's [ ]  MRI pending. [ ]  cardiac echo [ ]  carotid US Hba1c 5.9 lipids with LDL 163, HDL 36, tchol 211 (started on atorva) [ ]  f/u baseline CK [ ]  hypercoag panel pending [ ]  vasculitis labs pending [ ]  CXR pending PT/OT/Speech Permissive HTN, hydral for systolic greater than 220, diastolic over 120 ASA/plavix started IR [ ]  Possibly candidate for L MCA stenting, cerebral angiogram done today with severe preocclusive stenosis (95% plus) T MCA M1 segment and approx 50% stenosis of Lt VA origin.  Will f/u recs.  [ ]  Per neuro, given fam hx VTE, f/u LE dopplers, TEE, and transcranial bubble study  HLD - atorva 80  HTN: Holding daily lisinopril Hydral for >220/120  Hypokalemia: replete prn  DVT prophylaxis: SCD Code Status: full  Family Communication: sister in room  Disposition Plan: pending PT/OT/Speech, possible intervention   Consultants:   Neuro  IR  Procedures: (Don't include imaging studies which can be auto populated. Include things that cannot be auto populated i.e. Echo, Carotid and venous dopplers, Foley, Bipap, HD, tubes/drains, wound vac, central lines etc)  Cerebral arteriogram  Echo,  LE [place], carotid US pending  Antimicrobials: (specify start and planned stop date. Auto populated tables are space occupying and do not give end dates)  none    Subjective: Hand is [name] little better.  Feels like his leg is ok.  Speech and RUE still have weakness.  No new sx.   Objective: Vitals:   12/27/16 1225 12/27/16 1255 12/27/16 1325 12/27/16 1355  BP: (!) 169/103 (!) 168/105 (!) 170/100 (!) 163/92  Pulse: (!) 107 99 83 92  Resp:   16   Temp:      TempSrc:      SpO2:      Weight:      Height:        Intake/Output Summary (Last 24 hours) at 12/27/16 1500 Last data filed at 12/27/16 0300  Gross per 24 hour  Intake           263.33 ml  Output                0 ml  Net           263.33 ml   Filed Weights   12/26/16 1900 12/26/16 2043 12/26/16 2307  Weight: 102 kg (224 lb 13.9 oz) 101.6 kg (224 lb) 101.6 kg (224 lb)    Examination:  General exam: Appears calm and comfortable  Respiratory system: Clear to auscultation. Respiratory effort normal. Cardiovascular system: S1 & S2 heard, RRR. No JVD, murmurs, rubs, gallops or clicks. No pedal edema. Gastrointestinal system: Abdomen  is nondistended, soft and nontender. No organomegaly or masses felt. Normal bowel sounds heard. Central nervous system: Alert and oriented. Limited as on bed rest after procedure.  R sided facial droop.  R side sided grip weakness.  Moving all other extremities, but limited exam due to bed rest. Extremities: palpable pedal pulses, no LEE Skin: No rashes, lesions or ulcers Psychiatry: Judgement and insight appear normal. Mood & affect appropriate.     Data Reviewed: I have personally reviewed following labs and imaging studies  CBC:  Recent Labs Lab 12/26/16 1947 12/26/16 1957 12/27/16 0200  WBC 9.5  --  7.4  NEUTROABS 6.1  --   --   HGB 13.7 15.0 12.6*  HCT 41.1 44.0 37.7*  MCV 80.9  --  80.9  PLT 250  --  249   Basic Metabolic Panel:  Recent Labs Lab 12/26/16 1947 12/26/16 1957  12/27/16 0200  NA 138 140 139  K 3.6 3.7 3.4*  CL 105 104 105  CO2 24  --  26  GLUCOSE 107* 106* 127*  BUN 8 12 9   CREATININE 1.29* 1.20 1.20  CALCIUM 9.1  --  8.8*   GFR: Estimated Creatinine Clearance: 98.3 mL/min (by C-G formula based on SCr of 1.2 mg/dL). Liver Function Tests:  Recent Labs Lab 12/26/16 1947 12/27/16 0200  AST 24 21  ALT 27 24  ALKPHOS 85 76  BILITOT 0.5 0.7  PROT 7.3 6.3*  ALBUMIN 4.1 3.5   No results for input(s): LIPASE, AMYLASE in the last 168 hours. No results for input(s): AMMONIA in the last 168 hours. Coagulation Profile:  Recent Labs Lab 12/26/16 1947  INR 0.98   Cardiac Enzymes: No results for input(s): CKTOTAL, CKMB, CKMBINDEX, TROPONINI in the last 168 hours. BNP (last 3 results) No results for input(s): PROBNP in the last 8760 hours. HbA1C:  Recent Labs  12/27/16 0200  HGBA1C 5.9*   CBG: No results for input(s): GLUCAP in the last 168 hours. Lipid Profile:  Recent Labs  12/27/16 0200  CHOL 211*  HDL 36*  LDLCALC 163*  TRIG 58  CHOLHDL 5.9   Thyroid Function Tests:  Recent Labs  12/27/16 0017  TSH 2.349   Anemia Panel:  Recent Labs  12/27/16 0200  VITAMINB12 470   Sepsis Labs: No results for input(s): PROCALCITON, LATICACIDVEN in the last 168 hours.  No results found for this or any previous visit (from the past 240 hour(s)).       Radiology Studies: Ct Angio Head W Or Wo Contrast  Result Date: 12/26/2016 CLINICAL DATA:  Acute ischemic stroke. EXAM: CT ANGIOGRAPHY HEAD AND NECK CT PERFUSION BRAIN TECHNIQUE: Multidetector CT imaging of the head and neck was performed using the standard protocol during bolus administration of intravenous contrast. Multiplanar CT image reconstructions and MIPs were obtained to evaluate the vascular anatomy. Carotid stenosis measurements (when applicable) are obtained utilizing NASCET criteria, using the distal internal carotid diameter as the denominator. Multiphase CT  imaging of the brain was performed following IV bolus contrast injection. Subsequent parametric perfusion maps were calculated using RAPID software. CONTRAST:  100 cc Isovue 370 intravenous COMPARISON:  Head CT earlier today FINDINGS: CTA NECK FINDINGS Aortic arch: Negative.  Two vessel branching. Right carotid system: The vessels are smooth and widely patent. Left carotid system: Vessels are smooth and widely patent. No atheromatous changes. Vertebral arteries: No proximal subclavian stenosis. Both vertebral arteries are smooth and patent to the dura. Skeleton: No acute or aggressive finding. Other neck: No  incidental mass or adenopathy. Upper chest: No acute finding Review of the MIP images confirms the above findings CTA HEAD FINDINGS Anterior circulation: Smaller right ICA in the setting of hypoplastic right A1 segment. There is also Myleen Brailsford larger left posterior communicating artery. Severe left M1 segment stenosis with extrinsic narrowing rather than the luminal filling defect. There is symmetric opacification of M2 branches. Negative for aneurysm or generalized beading. Posterior circulation: Symmetric vertebral arteries. Hypoplastic left P1 segment. PCA vessels are difficult to separate from neighboring opacified veins. No branch occlusion or flow limiting stenosis seen. Negative for aneurysm or beading. Venous sinuses: Negative Anatomic variants: None other than described above. Delayed phase:  Not obtained in the emergent setting. Review of the MIP images confirms the above findings CT Brain Perfusion Findings: CBF (<30%) Volume: 0mL Perfusion (Tmax>6.0s) volume: 5mL - but not matching the area of symptomatic concern or the CT findings, favored artifactual. Transit time maps do show asymmetric left parietal transit in keeping with stenosis. These results were called by telephone at the time of interpretation on 12/26/2016 at 8:25 pm to Dr. Caryl Pina , who verbally acknowledged these results. IMPRESSION: 1.  Severe left M1 segment stenosis. The other vessels are unremarkable, suggesting non atheromatous cause. 2. No infarct by cerebral perfusion. The low-density on prior CT is likely below the size threshold and maybe subacute. Electronically Signed   By: Marnee Spring M.D.   On: 12/26/2016 20:40   Ct Angio Neck W Or Wo Contrast  Result Date: 12/26/2016 CLINICAL DATA:  Acute ischemic stroke. EXAM: CT ANGIOGRAPHY HEAD AND NECK CT PERFUSION BRAIN TECHNIQUE: Multidetector CT imaging of the head and neck was performed using the standard protocol during bolus administration of intravenous contrast. Multiplanar CT image reconstructions and MIPs were obtained to evaluate the vascular anatomy. Carotid stenosis measurements (when applicable) are obtained utilizing NASCET criteria, using the distal internal carotid diameter as the denominator. Multiphase CT imaging of the brain was performed following IV bolus contrast injection. Subsequent parametric perfusion maps were calculated using RAPID software. CONTRAST:  100 cc Isovue 370 intravenous COMPARISON:  Head CT earlier today FINDINGS: CTA NECK FINDINGS Aortic arch: Negative.  Two vessel branching. Right carotid system: The vessels are smooth and widely patent. Left carotid system: Vessels are smooth and widely patent. No atheromatous changes. Vertebral arteries: No proximal subclavian stenosis. Both vertebral arteries are smooth and patent to the dura. Skeleton: No acute or aggressive finding. Other neck: No incidental mass or adenopathy. Upper chest: No acute finding Review of the MIP images confirms the above findings CTA HEAD FINDINGS Anterior circulation: Smaller right ICA in the setting of hypoplastic right A1 segment. There is also Alexsis Branscom larger left posterior communicating artery. Severe left M1 segment stenosis with extrinsic narrowing rather than the luminal filling defect. There is symmetric opacification of M2 branches. Negative for aneurysm or generalized beading.  Posterior circulation: Symmetric vertebral arteries. Hypoplastic left P1 segment. PCA vessels are difficult to separate from neighboring opacified veins. No branch occlusion or flow limiting stenosis seen. Negative for aneurysm or beading. Venous sinuses: Negative Anatomic variants: None other than described above. Delayed phase:  Not obtained in the emergent setting. Review of the MIP images confirms the above findings CT Brain Perfusion Findings: CBF (<30%) Volume: 0mL Perfusion (Tmax>6.0s) volume: 5mL - but not matching the area of symptomatic concern or the CT findings, favored artifactual. Transit time maps do show asymmetric left parietal transit in keeping with stenosis. These results were called by telephone at the  time of interpretation on 12/26/2016 at 8:25 pm to Dr. Caryl Pina , who verbally acknowledged these results. IMPRESSION: 1. Severe left M1 segment stenosis. The other vessels are unremarkable, suggesting non atheromatous cause. 2. No infarct by cerebral perfusion. The low-density on prior CT is likely below the size threshold and maybe subacute. Electronically Signed   By: Marnee Spring M.D.   On: 12/26/2016 20:40   Ct Cerebral Perfusion W Contrast  Result Date: 12/26/2016 CLINICAL DATA:  Acute ischemic stroke. EXAM: CT ANGIOGRAPHY HEAD AND NECK CT PERFUSION BRAIN TECHNIQUE: Multidetector CT imaging of the head and neck was performed using the standard protocol during bolus administration of intravenous contrast. Multiplanar CT image reconstructions and MIPs were obtained to evaluate the vascular anatomy. Carotid stenosis measurements (when applicable) are obtained utilizing NASCET criteria, using the distal internal carotid diameter as the denominator. Multiphase CT imaging of the brain was performed following IV bolus contrast injection. Subsequent parametric perfusion maps were calculated using RAPID software. CONTRAST:  100 cc Isovue 370 intravenous COMPARISON:  Head CT earlier today  FINDINGS: CTA NECK FINDINGS Aortic arch: Negative.  Two vessel branching. Right carotid system: The vessels are smooth and widely patent. Left carotid system: Vessels are smooth and widely patent. No atheromatous changes. Vertebral arteries: No proximal subclavian stenosis. Both vertebral arteries are smooth and patent to the dura. Skeleton: No acute or aggressive finding. Other neck: No incidental mass or adenopathy. Upper chest: No acute finding Review of the MIP images confirms the above findings CTA HEAD FINDINGS Anterior circulation: Smaller right ICA in the setting of hypoplastic right A1 segment. There is also Lelon Ikard larger left posterior communicating artery. Severe left M1 segment stenosis with extrinsic narrowing rather than the luminal filling defect. There is symmetric opacification of M2 branches. Negative for aneurysm or generalized beading. Posterior circulation: Symmetric vertebral arteries. Hypoplastic left P1 segment. PCA vessels are difficult to separate from neighboring opacified veins. No branch occlusion or flow limiting stenosis seen. Negative for aneurysm or beading. Venous sinuses: Negative Anatomic variants: None other than described above. Delayed phase:  Not obtained in the emergent setting. Review of the MIP images confirms the above findings CT Brain Perfusion Findings: CBF (<30%) Volume: 0mL Perfusion (Tmax>6.0s) volume: 5mL - but not matching the area of symptomatic concern or the CT findings, favored artifactual. Transit time maps do show asymmetric left parietal transit in keeping with stenosis. These results were called by telephone at the time of interpretation on 12/26/2016 at 8:25 pm to Dr. Caryl Pina , who verbally acknowledged these results. IMPRESSION: 1. Severe left M1 segment stenosis. The other vessels are unremarkable, suggesting non atheromatous cause. 2. No infarct by cerebral perfusion. The low-density on prior CT is likely below the size threshold and maybe subacute.  Electronically Signed   By: Marnee Spring M.D.   On: 12/26/2016 20:40   Ct Head Code Stroke Wo Contrast  Result Date: 12/26/2016 CLINICAL DATA:  Code stroke.  Right-sided weakness.  Slurred speech. EXAM: CT HEAD WITHOUT CONTRAST TECHNIQUE: Contiguous axial images were obtained from the base of the skull through the vertex without intravenous contrast. COMPARISON:  12/05/2016 FINDINGS: Brain: There is Osei Anger small newly seen area of left precentral gyrus gray-white differentiation loss with subjacent streaky low-density in the white matter. No hemorrhage or hydrocephalus. No masslike findings. Vascular: No hyperdense vessel. Skull: No acute or aggressive finding. Calcifications superior to the left orbit. Sinuses/Orbits: Patchy mucosal thickening in the nasal cavity with some polypoid features. Other: Text page with  prelim sent 12/26/2016 at 7:53 pm to Dr. Otelia Limes ASPECTS [facility] Stroke Program Early CT Score) - Ganglionic level infarction (caudate, lentiform nuclei, internal capsule, insula, M1-M3 cortex): 7 - Supraganglionic infarction (M4-M6 cortex): 2 Total score (0-10 with 10 being normal): 9 IMPRESSION: 1. Small cortical infarct in the left precentral gyrus, not seen 12/05/2016. Ischemic changes also seen in the subjacent white matter. ASPECTS is 9. 2. No acute hemorrhage. Electronically Signed   By: Marnee Spring M.D.   On: 12/26/2016 19:55        Scheduled Meds: . aspirin  81 mg Oral Daily  . atorvastatin  80 mg Oral q1800  . clopidogrel  75 mg Oral Daily  . fentaNYL      . fluticasone  2 spray Each Nare Daily  . heparin lock flush      . hydrALAZINE      . iopamidol      . lidocaine (PF)      . midazolam       Continuous Infusions: . sodium chloride 100 mL/hr at 12/27/16 0022  . sodium chloride       LOS: 1 day    Time spent: 35 min    Mellie Buccellato Grier Mitts, MD [facility] [phone]   If 7PM-7AM, please contact night-coverage [email or web address] Password  [email or web address] 12/27/2016, 3:00 PM

## 2016-12-27 NOTE — Progress Notes (Signed)
SLP Cancellation Note  Patient Details Name: [name] MRN: [id number] DOB: 1975-06-18   Cancelled treatment:       Reason Eval/Treat Not Completed: Patient at procedure or test/unavailable; SLP will continue efforts    Marcene Duos MA, CCC-SLP Acute Care Speech Language Pathologist    Kennieth Rad 12/27/2016, 10:42 AM

## 2016-12-27 NOTE — Procedures (Signed)
S/P 4 vessel cerebral arteriogram RT CFA approach. Findings. 1.Severe preocclusive  (95% plus) stenosis LT MCA M1 seg . 2.Approx 50 % stenosis of origin of Lt VA origin

## 2016-12-28 ENCOUNTER — Inpatient Hospital Stay (HOSPITAL_COMMUNITY): Payer: BLUE CROSS/BLUE SHIELD

## 2016-12-28 ENCOUNTER — Encounter (HOSPITAL_COMMUNITY): Payer: Self-pay | Admitting: General Practice

## 2016-12-28 DIAGNOSIS — R002 Palpitations: Secondary | ICD-10-CM

## 2016-12-28 DIAGNOSIS — I6602 Occlusion and stenosis of left middle cerebral artery: Secondary | ICD-10-CM

## 2016-12-28 DIAGNOSIS — Z86718 Personal history of other venous thrombosis and embolism: Secondary | ICD-10-CM

## 2016-12-28 DIAGNOSIS — I82431 Acute embolism and thrombosis of right popliteal vein: Secondary | ICD-10-CM

## 2016-12-28 LAB — MPO/PR-3 (ANCA) ANTIBODIES
ANCA Proteinase 3: <3.5 U/mL (ref 0.0–3.5)
Myeloperoxidase Abs: <9 U/mL (ref 0.0–9.0)

## 2016-12-28 LAB — TSH: TSH: 4.385 u[IU]/mL (ref 0.350–4.500)

## 2016-12-28 LAB — C4 COMPLEMENT: COMPLEMENT C4, BODY FLUID: 50 mg/dL — AB (ref 14–44)

## 2016-12-28 LAB — HOMOCYSTEINE: Homocysteine: 9 umol/L (ref 0.0–15.0)

## 2016-12-28 LAB — PROTEIN C ACTIVITY: Protein C Activity: 119 % (ref 73–180)

## 2016-12-28 LAB — PROTEIN S ACTIVITY: PROTEIN S ACTIVITY: 113 % (ref 63–140)

## 2016-12-28 LAB — LUPUS ANTICOAGULANT PANEL
DRVVT: 31.7 s (ref 0.0–47.0)
PTT Lupus Anticoagulant: 31.4 s (ref 0.0–51.9)

## 2016-12-28 LAB — HEPARIN LEVEL (UNFRACTIONATED): HEPARIN UNFRACTIONATED: 0.14 [IU]/mL — AB (ref 0.30–0.70)

## 2016-12-28 LAB — C3 COMPLEMENT: C3 Complement: 179 mg/dL — ABNORMAL HIGH (ref 82–167)

## 2016-12-28 LAB — PROTEIN S, TOTAL: PROTEIN S AG TOTAL: 89 % (ref 60–150)

## 2016-12-28 MED ORDER — ASPIRIN EC 325 MG PO TBEC
325.0000 mg | DELAYED_RELEASE_TABLET | Freq: Every day | ORAL | Status: DC
  Administered 2016-12-28: 325 mg via ORAL
  Filled 2016-12-28 (×2): qty 1

## 2016-12-28 MED ORDER — HYDRALAZINE HCL 20 MG/ML IJ SOLN: 5.0000 mg | Freq: Four times a day (QID) | INTRAMUSCULAR | Status: DC | PRN

## 2016-12-28 MED ORDER — HEPARIN (PORCINE) IN NACL 100-0.45 UNIT/ML-% IJ SOLN
1100.0000 [IU]/h | INTRAMUSCULAR | Status: DC
  Administered 2016-12-28: 1000 [IU]/h via INTRAVENOUS
  Administered 2016-12-29 – 2016-12-30 (×2): 1200 [IU]/h via INTRAVENOUS
  Filled 2016-12-28 (×4): qty 250

## 2016-12-28 NOTE — Progress Notes (Signed)
ANTICOAGULATION CONSULT NOTE - Follow Up Consult  Pharmacy Consult for heparin Indication: CVA, RLE VTE of peroneal veins  Allergies  Allergen Reactions  . Penicillins Other (See Comments)    Shut the kidney's down Has patient had a PCN reaction causing immediate rash, facial/tongue/throat swelling, SOB or lightheadedness with hypotension: No Has patient had a PCN reaction causing severe rash involving mucus membranes or skin necrosis: No Has patient had a PCN reaction that required hospitalization: yes Has patient had a PCN reaction occurring within the last 10 years: No If all of the above answers are "NO", then may proceed with Cephalosporin use.  . Robaxin [Methocarbamol]   . Toradol [Ketorolac Tromethamine]     Patient Measurements: Height: 5\' 11"  (180.3 cm) Weight: 224 lb (101.6 kg) IBW/kg (Calculated) : 75.3 Heparin Dosing Weight: 96 kg  Vital Signs: Temp: 98.5 F (36.9 C) (08/27 1641) Temp Source: Oral (08/27 1641) BP: 137/81 (08/27 1641) Pulse Rate: 76 (08/27 1641)  Labs:  Recent Labs  12/26/16 1947 12/26/16 1957 12/27/16 0200 12/27/16 1517 12/28/16 1819  HGB 13.7 15.0 12.6*  --   --   HCT 41.1 44.0 37.7*  --   --   PLT 250  --  249  --   --   APTT 29  --   --   --   --   LABPROT 12.9  --   --   --   --   INR 0.98  --   --   --   --   HEPARINUNFRC  --   --   --   --  0.14*  CREATININE 1.29* 1.20 1.20  --   --   CKTOTAL  --   --   --  235  --     Estimated Creatinine Clearance: 98.3 mL/min (by C-G formula based on SCr of 1.2 mg/dL).   Medications:  Scheduled:  . aspirin EC  325 mg Oral Daily  . atorvastatin  80 mg Oral q1800  . clopidogrel  75 mg Oral Daily  . fluticasone  2 spray Each Nare Daily   Infusions:  . heparin 1,000 Units/hr (12/28/16 1332)    Assessment: 41 yo male with CVA, RLE VTE of peroneal veins is currently on subtherapeutic heparin.  Heparin level is 0.14.  Goal of Therapy:  Heparin level 0.3-0.5 units/ml Monitor  platelets by anticoagulation protocol: Yes   Plan:  - increase heparin to 1300 units/hr  - 6hr heparin level  Tanieka Pownall, Tsz-Yin 12/28/2016,6:59 PM
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
## 2016-12-28 NOTE — Progress Notes (Signed)
    [facility] has been requested to perform a transesophageal echocardiogram on [name] for CVA.  After careful review of history and examination, the risks and benefits of transesophageal echocardiogram have been explained including risks of esophageal damage, perforation (1:10,000 risk), bleeding, pharyngeal hematoma as well as other potential complications associated with conscious sedation including aspiration, arrhythmia, respiratory failure and death. Alternatives to treatment were discussed, questions were answered. Patient is willing to proceed.    Nada Boozer, NP  12/28/2016 3:39 PM

## 2016-12-28 NOTE — Progress Notes (Signed)
TR groin angio site is intact at level 0 no bleeding or hematoma noted bedrest with post procedure vital sighs completed.  Stroke education and risk factors modification  Given and pt demonstrate understanding with a feed back method.
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
## 2016-12-28 NOTE — Progress Notes (Addendum)
*  Preliminary Results* Bilateral lower extremity venous duplex completed. The right lower extremity is positive for acute deep vein thrombosis involving the right peroneal veins. The left lower extremity is negative for deep vein thrombosis. There is no evidence of Baker's cyst bilaterally.  Preliminary results discussed with Dr. Lowell Guitar.  12/28/2016 8:46 AM Gertie Fey, BS, RVT, RDCS, RDMS

## 2016-12-28 NOTE — Progress Notes (Signed)
Patient ID: [name], male   DOB: 06-26-1975, 41 y.o.   MRN: [id number]   CVA 8/25  Examination of pt today: Up in room; walking on own Right arm still weak Rt facial droop is slight Speech is minimally changed. New RLE DVT Heparin IV running   Dr Corliss Skains discussion with pt of potential L MCA stenotic artery intervention Will follow
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
## 2016-12-28 NOTE — Evaluation (Signed)
Physical Therapy Evaluation and Discharge Patient Details Name: [name] MRN: [id number] DOB: [birth year] Today's Date: 12/28/2016   History of Present Illness  [name]  is a 41 y.o. male, w hypertension, hyperlipidemia who presented to ED with slurred speech, facial droop and right sided weakness. CT revealed Small cortical infarct in the left precentral gyrus.   Clinical Impression  Pt with noted R UE weakness however R LE appears back to baseline. Pt at minimal fall risk as demod by score of 24/24 on DGI. Pt functioning at mod I level of function. OT focusing on R UE deficits and reports recommending outpt OT upon d/c to address R UE deficits. Pt with no further acute PT needs at this time. PT SIGNING OFF. Please re-consult if needed in future.    Follow Up Recommendations No PT follow up    Equipment Recommendations  None recommended by PT    Recommendations for Other Services       Precautions / Restrictions Precautions Precautions: Other (comment) Precaution Comments: mild aphasia Restrictions Weight Bearing Restrictions: No      Mobility  Bed Mobility               General bed mobility comments: pt up in chair upon PT arrival  Transfers Overall transfer level: Modified independent Equipment used: None Transfers: Sit to/from Stand Sit to Stand: Modified independent (Device/Increase time)         General transfer comment: no difficulty  Ambulation/Gait Ambulation/Gait assistance: Modified independent (Device/Increase time) Ambulation Distance (Feet): 600 Feet Assistive device: None Gait Pattern/deviations: WFL(Within Functional Limits) Gait velocity: wfl Gait velocity interpretation: at or above normal speed for age/gender General Gait Details: no difficulties or episodes of LOB  Stairs Stairs: Yes Stairs assistance: Supervision Stair Management: Alternating pattern;No rails Number of Stairs: 12 General stair comments: no  difficulties  Wheelchair Mobility    Modified Rankin (Stroke Patients Only) Modified Rankin (Stroke Patients Only) Pre-Morbid Rankin Score: No symptoms Modified Rankin: Slight disability     Balance                                 Standardized Balance Assessment Standardized Balance Assessment : Dynamic Gait Index   Dynamic Gait Index Level Surface: Normal Change in Gait Speed: Normal Gait with Horizontal Head Turns: Normal Gait with Vertical Head Turns: Normal Gait and Pivot Turn: Normal Step Over Obstacle: Normal Step Around Obstacles: Normal Steps: Normal Total Score: 24       Pertinent Vitals/Pain Pain Assessment: No/denies pain    Home Living Family/patient expects to be discharged to:: Private residence Living Arrangements: Spouse/significant other;Children Available Help at Discharge: Available PRN/intermittently Type of Home: House Home Access: Stairs to enter Entrance Stairs-Rails: None Entrance Stairs-Number of Steps: 3 Home Layout: Able to live on main level with bedroom/bathroom   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  Comments: Two weeks ago, symptoms of CVA and was requiring compensatory techniques for writing, self feeding, driving, and performing work duties.      Hand Dominance   Dominant Hand: Right    Extremity/Trunk Assessment   Upper Extremity Assessment Upper Extremity Assessment: RUE deficits/detail RUE Deficits / Details: Weakness at shoulder and elbow  as seen by poor performance to bring hand to mouth with use of shoulder hiking. Decreased grasp strength, pinch strength, and FM skills.  RUE Sensation: decreased light touch RUE Coordination: decreased fine motor;decreased gross  motor (very difficulty for pt to eat with R hand even with DME)    Lower Extremity Assessment Lower Extremity Assessment: Overall WFL for tasks assessed    Cervical / Trunk Assessment Cervical / Trunk Assessment: Normal  Communication   Communication: Expressive difficulties (mild delay and word finding difficulties)  Cognition Arousal/Alertness: Awake/alert Behavior During Therapy: WFL for tasks assessed/performed Overall Cognitive Status: Within Functional Limits for tasks assessed                                 General Comments: pt with good awareness of deficits however suspect some higher level executive functioning deficits      General Comments      Exercises     Assessment/Plan    PT Assessment Patent does not need any further PT services  PT Problem List         PT Treatment Interventions      PT Goals (Current goals can be found in the Care Plan section)  Acute Rehab PT Goals Patient Stated Goal: Go home and return to PLOF PT Goal Formulation: All assessment and education complete, DC therapy    Frequency     Barriers to discharge        Co-evaluation               AM-PAC PT "6 Clicks" Daily Activity  Outcome Measure Difficulty turning over in bed (including adjusting bedclothes, sheets and blankets)?: None Difficulty moving from lying on back to sitting on the side of the bed? : None Difficulty sitting down on and standing up from a chair with arms (e.g., wheelchair, bedside commode, etc,.)?: None Help needed moving to and from a bed to chair (including a wheelchair)?: None Help needed walking in hospital room?: None Help needed climbing 3-5 steps with a railing? : None 6 Click Score: 24    End of Session   Activity Tolerance: Patient tolerated treatment well Patient left: in chair;with call bell/phone within reach Nurse Communication: Mobility status (desire to have condom cath removed) PT Visit Diagnosis: Other symptoms  and signs involving the nervous system (R29.898)    Time: 7793-9030 PT Time Calculation (min) (ACUTE ONLY): 23 min   Charges:   PT Evaluation $PT Eval Moderate Complexity: 1 Mod PT Treatments $Gait Training: 8-22 mins   PT G Codes:        [name], PT, DPT Pager #: [phone] Office #: [phone]   Tyrhonda Georgiades M Senai Ramnath 12/28/2016, 2:43 PM

## 2016-12-28 NOTE — Evaluation (Signed)
Occupational Therapy Evaluation Patient Details Name: [name] MRN: [id number] DOB: [birth year] Today's Date: 12/28/2016    History of Present Illness [name]  is a 41 y.o. male, w hypertension, hyperlipidemia who presented to ED with slurred speech, facial droop and right sided weakness. CT revealed Small cortical infarct in the left precentral gyrus.    Clinical Impression   PTA, pt was living with his significant other and children. 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 Pt currently requiring Min Guard A for ADLs and functional mobility for safety. Pt presenting with weakness in RUE which impacts his functional performance. Also presenting with higher level cognitive deficits and would benefit from outpatient neuro at dc. Consulted MD for future driving, and educated pt on waiting for MD clearance for safety.  Recommend dc home with Outpatient OT for hand therapy and neuro to optimize pt independent and safety.     Follow Up Recommendations  Outpatient OT;Supervision - Intermittent (Outpatient hand therapy and neuro)    Equipment Recommendations  None recommended by OT    Recommendations for Other Services PT consult     Precautions / Restrictions Precautions Precautions: Fall Restrictions Weight Bearing Restrictions: No      Mobility Bed Mobility Overal bed mobility: Modified Independent             General bed mobility comments: Increased time with HOB elevated  Transfers Overall transfer level: Needs assistance Equipment used: None Transfers: Sit to/from Stand Sit to Stand: Min guard         General transfer comment: Min Guard for safety    Balance Overall balance assessment: Needs assistance Sitting-balance support: No upper extremity supported;Feet supported Sitting balance-Leahy Scale: Good Sitting balance - Comments: Able to bend forward for don shoes without  LOB   Standing balance support: No upper extremity supported;During functional activity Standing balance-Leahy Scale: Fair Standing balance comment: Min Guard for safety; no physical A or UE support required                           ADL either performed or assessed with clinical judgement   ADL Overall ADL's : Needs assistance/impaired Eating/Feeding: Set up;Sitting Eating/Feeding Details (indicate cue type and reason): Pt able to use non-dominant hand (LUE). Pt attempts to use R hand however, has difficulty due to weakness.  Grooming: Min guard;Standing;Oral care Grooming Details (indicate cue type and reason): Pt performed oral care using L hand primarily to perform task. Used R hand to squeeze tooth paste and WB on counter. Pt attempt to brush with R hand, but was unable to maintain grasp of tooth brush and had to switch back to L hand. Provided built up handles to assist with self feeding and grooming.  Upper Body Bathing: Min guard;Sitting   Lower Body Bathing: Min guard;Sit to/from stand   Upper Body Dressing : Min guard;Sitting   Lower Body Dressing: Min guard;Sit to/from stand Lower Body Dressing Details (indicate cue type and reason): Pt able to don shoes. Requiring increased time to tie shoe laces.  Toilet Transfer: Min guard (Simulated to Investment banker, corporate Details (indicate cue type and reason): Min Guard for safety         Functional mobility during ADLs: Min guard;+2 for safety/equipment General ADL Comments: Pt presenting with weakness at RUE impacting his performance of ADLs.  Pt would benefit from outpatient OT - hand therapy  and neuro     Vision Baseline Vision/History: No visual deficits Patient Visual Report: No change from baseline;Other (comment) (Reports no change)       Perception     Praxis      Pertinent Vitals/Pain Pain Assessment: Faces Faces Pain Scale: No hurt Pain Intervention(s): Monitored during session     Hand  Dominance Right   Extremity/Trunk Assessment Upper Extremity Assessment Upper Extremity Assessment: RUE deficits/detail RUE Deficits / Details: Weakness at shoulder and elbow as seen by poor performance to bring hand to mouth with use of shoulder hiking. Decreased grasp strength, pinch strength, and FM skills.  RUE Sensation:  (Reports numbness or feeling cold) RUE Coordination: decreased fine motor;decreased gross motor   Lower Extremity Assessment Lower Extremity Assessment: Defer to PT evaluation   Cervical / Trunk Assessment Cervical / Trunk Assessment: Normal   Communication Communication Communication: Expressive difficulties (Articulation)   Cognition Arousal/Alertness: Awake/alert Behavior During Therapy: WFL for tasks assessed/performed Overall Cognitive Status: Impaired/Different from baseline Area of Impairment: Problem solving                             Problem Solving: Slow processing General Comments: Pt presents with good awareness or deficits and recieves compensatory techniques/education well.Pt demonstrating higher level cogntitive deficits such as requiring increased time for simple math.    General Comments  Educated on WB through RUE during ADLs, BEFAST, and risk of learned non-use.    Exercises Exercises: Other exercises Other Exercises Other Exercises: Provided pt with putty and theraband for UE. 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   Comments: Two weeks ago, symptoms of CVA and was requiring compensatory techniques for writing, self feeding, driving, and performing work duties.         OT Problem List: Decreased range of motion;Decreased activity tolerance;Impaired balance (sitting and/or standing);Decreased cognition;Decreased safety awareness;Decreased knowledge of use of DME or AE;Decreased knowledge of precautions;Impaired UE functional use      OT Treatment/Interventions: Self-care/ADL training;Therapeutic exercise;Energy conservation;DME and/or AE instruction;Therapeutic activities;Patient/family education    OT Goals(Current goals can be found in the care plan section) Acute Rehab OT Goals Patient Stated Goal: Go home and return to PLOF OT Goal Formulation: With patient Time For Goal Achievement: 01/11/17 Potential to Achieve Goals: Good  OT Frequency: Min 2X/week   Barriers to D/C:            Co-evaluation              AM-PAC PT "6 Clicks" Daily Activity     Outcome Measure Help from another person eating meals?: A Little Help from another person taking care of personal grooming?: A Little Help from another person toileting,  which includes using toliet, bedpan, or urinal?: A Little Help from another person bathing (including washing, rinsing, drying)?: A Little Help from another person to put on and taking off regular upper body clothing?: A Little Help from another person to put on and taking off regular lower body clothing?: A Little 6 Click Score: 18   End of Session Equipment Utilized During Treatment: Gait belt Nurse Communication: Mobility  status;Precautions  Activity Tolerance: Patient tolerated treatment well Patient left: in chair;with call bell/phone within reach;with family/visitor present  OT Visit Diagnosis: Unsteadiness on feet (R26.81);Hemiplegia and hemiparesis;Other symptoms and signs involving cognitive function Hemiplegia - Right/Left: Right Hemiplegia - dominant/non-dominant: Dominant Hemiplegia - caused by: Cerebral infarction                Time: 9147-8295 OT Time Calculation (min): 37 min Charges:  OT General Charges $OT Visit: 1 Visit OT Evaluation $OT Eval Moderate Complexity: 1 Mod OT Treatments $Self Care/Home Management : 8-22 mins G-Codes:     Dymphna Wadley MSOT, OTR/L Acute Rehab Pager: [phone] Office: [phone]  Theodoro Grist Raynesha Tiedt 12/28/2016, 11:46 AM

## 2016-12-29 ENCOUNTER — Encounter (HOSPITAL_COMMUNITY)
Admission: EM | Disposition: A | Discharge status: Home / Self Care | Payer: Self-pay | Source: Home / Self Care | Attending: Family Medicine

## 2016-12-29 ENCOUNTER — Inpatient Hospital Stay (HOSPITAL_COMMUNITY): Payer: BLUE CROSS/BLUE SHIELD

## 2016-12-29 ENCOUNTER — Other Ambulatory Visit: Payer: Self-pay

## 2016-12-29 ENCOUNTER — Encounter (HOSPITAL_COMMUNITY): Payer: Self-pay

## 2016-12-29 DIAGNOSIS — I638 Other cerebral infarction: Secondary | ICD-10-CM

## 2016-12-29 DIAGNOSIS — I639 Cerebral infarction, unspecified: Secondary | ICD-10-CM

## 2016-12-29 HISTORY — PX: TEE WITHOUT CARDIOVERSION: SHX5443

## 2016-12-29 LAB — BASIC METABOLIC PANEL
Anion gap: 9 (ref 5–15)
BUN: 16 mg/dL (ref 6–20)
CALCIUM: 8.9 mg/dL (ref 8.9–10.3)
CO2: 24 mmol/L (ref 22–32)
CREATININE: 1.22 mg/dL (ref 0.61–1.24)
Chloride: 104 mmol/L (ref 101–111)
GFR calc Af Amer: >60 mL/min (ref >60)
Glucose, Bld: 115 mg/dL — ABNORMAL HIGH (ref 65–99)
Potassium: 3.8 mmol/L (ref 3.5–5.1)
SODIUM: 137 mmol/L (ref 135–145)

## 2016-12-29 LAB — HEPARIN LEVEL (UNFRACTIONATED)
HEPARIN UNFRACTIONATED: 0.59 [IU]/mL (ref 0.30–0.70)
Heparin Unfractionated: 0.48 IU/mL (ref 0.30–0.70)

## 2016-12-29 LAB — ANCA TITERS: P-ANCA: <1:20 {titer}

## 2016-12-29 LAB — ANTINUCLEAR ANTIBODIES, IFA: ANTINUCLEAR ANTIBODIES, IFA: NEGATIVE

## 2016-12-29 LAB — CBC
HCT: 39.2 % (ref 39.0–52.0)
Hemoglobin: 12.8 g/dL — ABNORMAL LOW (ref 13.0–17.0)
MCH: 27.1 pg (ref 26.0–34.0)
MCHC: 32.7 g/dL (ref 30.0–36.0)
MCV: 82.9 fL (ref 78.0–100.0)
PLATELETS: 260 10*3/uL (ref 150–400)
RBC: 4.73 MIL/uL (ref 4.22–5.81)
RDW: 14.1 % (ref 11.5–15.5)
WBC: 10.5 10*3/uL (ref 4.0–10.5)

## 2016-12-29 SURGERY — ECHOCARDIOGRAM, TRANSESOPHAGEAL
Anesthesia: Moderate Sedation

## 2016-12-29 MED ORDER — FENTANYL CITRATE (PF) 100 MCG/2ML IJ SOLN
INTRAMUSCULAR | Status: AC
  Filled 2016-12-29: qty 2

## 2016-12-29 MED ORDER — MIDAZOLAM HCL 10 MG/2ML IJ SOLN
INTRAMUSCULAR | Status: DC | PRN
  Administered 2016-12-29: 1 mg via INTRAVENOUS
  Administered 2016-12-29 (×2): 2 mg via INTRAVENOUS

## 2016-12-29 MED ORDER — BUTAMBEN-TETRACAINE-BENZOCAINE 2-2-14 % EX AERO
INHALATION_SPRAY | CUTANEOUS | Status: DC | PRN
  Administered 2016-12-29: 1 via TOPICAL

## 2016-12-29 MED ORDER — FENTANYL CITRATE (PF) 100 MCG/2ML IJ SOLN
INTRAMUSCULAR | Status: DC | PRN
  Administered 2016-12-29 (×2): 25 ug via INTRAVENOUS

## 2016-12-29 MED ORDER — MIDAZOLAM HCL 5 MG/ML IJ SOLN
INTRAMUSCULAR | Status: AC
  Filled 2016-12-29: qty 2

## 2016-12-29 MED ORDER — SODIUM CHLORIDE 0.9 % IV SOLN: INTRAVENOUS | Status: DC
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
## 2016-12-29 NOTE — Progress Notes (Signed)
OT Cancellation    12/29/16 0700  OT Visit Information  Last OT Received On 12/29/16  Reason Eval/Treat Not Completed Patient at procedure or test/ unavailable (Echo Lab. Will return as schedule allows. Thank you.)   [name] MSOT, OTR/L Acute Rehab Pager: [phone] Office: [phone]

## 2016-12-29 NOTE — Progress Notes (Signed)
Patient [name] bleeding small amount, IV has not been used. IV removed. No other signs of bleeding. Will continue to monitor patient for signs of bleeding elsewhere, heparin levels remain therapeutic.

## 2016-12-29 NOTE — CV Procedure (Signed)
Procedure: TEE  Indication: CVA  Sedation: Versed 5 mg IV, Fentanyl 50 mcg IV  Findings: Please see echo section for full report.  Normal LV size with moderate LV hypertrophy.  EF 60-65%.  Normal wall motion.  Normal RV size and systolic function.  Normal left atrial size with no LA appendage thrombus.  Normal right atrium.  There was no PFO or ASD by bubble study or color doppler.  No significant mitral regurgitation.  Trileaflet aortic valve with no stenosis or regurgitation.  No significant TR.  Normal caliber aorta with no significant plaque.    No source of embolus.  Marca Ancona 12/29/2016 8:33 AM

## 2016-12-29 NOTE — Plan of Care (Signed)
Problem: Ischemic Stroke/TIA Tissue Perfusion: Goal: Complications of ischemic stroke/TIA will be minimized Outcome: Progressing Discussed with patient tests, risk factors, medications, plan of care.

## 2016-12-29 NOTE — Progress Notes (Signed)
VASCULAR LAB PRELIMINARY  PRELIMINARY  PRELIMINARY  PRELIMINARY  Transcranial Doppler with Bubbles completed.    Preliminary report:  No apparent PFO  [name], RVS 12/29/2016, 2:35 PM

## 2016-12-29 NOTE — Progress Notes (Signed)
Patient ID: [name], male   DOB: 10-16-1975, 41 y.o.   MRN: [id number]   Touched base with pt today regarding possible intervention of L MCA He can call us for OP consultation if he feels appropriate He has information

## 2016-12-29 NOTE — Progress Notes (Signed)
ANTICOAGULATION CONSULT NOTE - Follow Up Consult  Pharmacy Consult for heparin Indication: RLE VTE of peroneal veins in setting of stroke  Labs:  Recent Labs  12/26/16 1947 12/26/16 1957 12/27/16 0200 12/27/16 1517 12/28/16 1819 12/29/16 0154  HGB 13.7 15.0 12.6*  --   --  12.8*  HCT 41.1 44.0 37.7*  --   --  39.2  PLT 250  --  249  --   --  260  APTT 29  --   --   --   --   --   LABPROT 12.9  --   --   --   --   --   INR 0.98  --   --   --   --   --   HEPARINUNFRC  --   --   --   --  0.14* 0.59  CREATININE 1.29* 1.20 1.20  --   --  1.22  CKTOTAL  --   --   --  235  --   --     Assessment: 41yo male now above goal on heparin after rate change; no gtt issues or signs of bleeding per RN.  Goal of Therapy:  Heparin level 0.3-0.5 units/ml   Plan:  Will decrease heparin gtt slightly to 1200 units/hr and check level in 6hr.  Vernard Gambles, PharmD, BCPS  12/29/2016,2:51 AM
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
## 2016-12-29 NOTE — Progress Notes (Signed)
Pt returned from unit, a&O x4, no complaints at this time.  Continue to monitor patient.

## 2016-12-29 NOTE — Progress Notes (Signed)
ANTICOAGULATION CONSULT NOTE - Follow Up Consult  Pharmacy Consult for heparin Indication: RLE VTE of peroneal veins in setting of stroke  Labs:  Recent Labs  12/26/16 1947 12/26/16 1957 12/27/16 0200 12/27/16 1517 12/28/16 1819 12/29/16 0154 12/29/16 1049  HGB 13.7 15.0 12.6*  --   --  12.8*  --   HCT 41.1 44.0 37.7*  --   --  39.2  --   PLT 250  --  249  --   --  260  --   APTT 29  --   --   --   --   --   --   LABPROT 12.9  --   --   --   --   --   --   INR 0.98  --   --   --   --   --   --   HEPARINUNFRC  --   --   --   --  0.14* 0.59 0.48  CREATININE 1.29* 1.20 1.20  --   --  1.22  --   CKTOTAL  --   --   --  235  --   --   --     Assessment: 41yo male now above goal on heparin for new DVT, also with recent stroke. Heparin level now down to 0.48 and therapeutic. Hgb stable at 12.8, plts wnl.  Goal of Therapy:  Heparin level 0.3-0.5 units/ml (with recent CVA)   Plan:  Continue heparin gtt at 1,200 units/hr. Monitor daily heparin level, CBC, s/s of bleed  Enzo Bi, PharmD, BCPS Clinical Pharmacist Pager [phone] 12/29/2016 1:02 PM

## 2016-12-29 NOTE — Progress Notes (Signed)
CM asked to do benefits check on NOAC for patient. Both Xarelto and Eliquis are $60/ month. Patient however will receive 30 day free card for either medication and will receive $10 co pay card. CM continuing to follow.

## 2016-12-29 NOTE — Interval H&P Note (Signed)
History and Physical Interval Note:  12/29/2016 8:10 AM  [name]  has presented today for surgery, with the diagnosis of stroke  The various methods of treatment have been discussed with the patient and family. After consideration of risks, benefits and other options for treatment, the patient has consented to  Procedure(s): TRANSESOPHAGEAL ECHOCARDIOGRAM (TEE) (N/A) as a surgical intervention .  The patient's history has been reviewed, patient examined, no change in status, stable for surgery.  I have reviewed the patient's chart and labs.  Questions were answered to the patient's satisfaction.     Quinterious Walraven [employer]

## 2016-12-29 NOTE — Progress Notes (Signed)
Occupational Therapy Treatment Patient Details Name: [name] MRN: [id number] DOB: 29-Dec-1975 Today's Date: 12/29/2016    History of present illness [name]  is a 41 y.o. male, w hypertension, hyperlipidemia who presented to ED with slurred speech, facial droop and right sided weakness. CT revealed Small cortical infarct in the left precentral gyrus.    OT comments  Pt continues to present with decreased strength, ROM, and grasp of RUE and hand. Provided pt with education and handout for theraputty and theraband exercises to increase strength and functional use of RUE/hand. Pt performed FM tasks including composite grasp and release, pincer grasp, and bilateral coordination tasks. Educated pt on self ROM to prevent tightening of RUE. Pt demonstrating understanding. Will continues to follow acutely to facilitate safe dc and increase occupational performance. Continues to recommend outpatient OT to maximize return to PLOF.   Follow Up Recommendations  Outpatient OT;Supervision - Intermittent    Equipment Recommendations  None recommended by OT    Recommendations for Other Services PT consult    Precautions / Restrictions Precautions Precautions: Other (comment) Precaution Comments: mild aphasia Restrictions Weight Bearing Restrictions: No       Mobility Bed Mobility Overal bed mobility: Modified Independent             General bed mobility comments: increased time and no phsyical A needed  Transfers Overall transfer level: Modified independent                    Balance Overall balance assessment: Needs assistance Sitting-balance support: No upper extremity supported;Feet supported Sitting balance-Leahy Scale: Good                                     ADL either performed or assessed with clinical judgement   ADL Overall ADL's : Needs assistance/impaired                                       General ADL  Comments: Focused session on FM and UE excercises/activties     Vision       Perception     Praxis      Cognition Arousal/Alertness: Awake/alert Behavior During Therapy: WFL for tasks assessed/performed Overall Cognitive Status: Within Functional Limits for tasks assessed                                          Exercises Exercises: Other exercises;General Upper Extremity General Exercises - Upper Extremity Shoulder Horizontal ABduction: AROM;Strengthening;Right;10 reps;Seated;Theraband Theraband Level (Shoulder Horizontal Abduction): Level 1 (Yellow) Shoulder Horizontal ADduction: AROM;Strengthening;Right;10 reps;Seated;Theraband Theraband Level (Shoulder Horizontal Adduction): Level 1 (Yellow) Elbow Flexion: AROM;Right;10 reps;Seated;Theraband Theraband Level (Elbow Flexion): Level 1 (Yellow) Elbow Extension: AROM;Right;10 reps;Seated;Theraband Theraband Level (Elbow Extension): Level 1 (Yellow) Wrist Extension: Self ROM;Right;10 reps;Seated Digit Composite Flexion: AROM;Right;10 reps;Strengthening;Seated Composite Extension: AROM;Self ROM;Right;15 reps;Seated Other Exercises Other Exercises: Targeted grasp and release task; 10 reps; seated EOB. Focused on maintaining composite grasp on water bottle on far R side of table and releasing it on far left side. Working through [employer] of movement with intentional grasp and release.  Other Exercises: FM handout provided with theraputty (tan/soft); focused on pincer grasp, rolling into logs, and pulling putty with R hand  Shoulder Instructions       General Comments continued education on self ROM, weight bearing, and preventing learnd non-use of RUE    Pertinent Vitals/ Pain       Pain Assessment: Faces Faces Pain Scale: Hurts even more Pain Location: Throat Pain Intervention(s): Monitored during session;Other (comment) (Gave him hot tea)  Home Living                                           Prior Functioning/Environment              Frequency  Min 2X/week        Progress Toward Goals  OT Goals(current goals can now be found in the care plan section)  Progress towards OT goals: Progressing toward goals  Acute Rehab OT Goals Patient Stated Goal: Go home and return to PLOF OT Goal Formulation: With patient Time For Goal Achievement: 01/11/17 Potential to Achieve Goals: Good ADL Goals Pt Will Perform Grooming: Independently;with adaptive equipment;standing Pt/caregiver will Perform Home Exercise Program: Increased ROM;Increased strength;Right Upper extremity;With theraband;With theraputty;With written HEP provided;Independently Additional ADL Goal #1: Pt will independently repeat BEFAST to increase stroke awareness  Plan Discharge plan remains appropriate    Co-evaluation                 AM-PAC PT "6 Clicks" Daily Activity     Outcome Measure   Help from another person eating meals?: A Little Help from another person taking care of personal grooming?: A Little Help from another person toileting, which includes using toliet, bedpan, or urinal?: A Little Help from another person bathing (including washing, rinsing, drying)?: A Little Help from another person to put on and taking off regular upper body clothing?: A Little Help from another person to put on and taking off regular lower body clothing?: A Little 6 Click Score: 18    End of Session    OT Visit Diagnosis: Unsteadiness on feet (R26.81);Hemiplegia and hemiparesis;Other symptoms and signs involving cognitive function Hemiplegia - Right/Left: Right Hemiplegia - dominant/non-dominant: Dominant Hemiplegia - caused by: Cerebral infarction   Activity Tolerance Patient tolerated treatment well   Patient Left in bed;with call bell/phone within reach   Nurse Communication Mobility status        Time: 6295-2841 OT Time Calculation (min): 33 min  Charges: OT General  Charges $OT Visit: 1 Visit OT Treatments $Therapeutic Activity: 23-37 mins  Talina Pleitez MSOT, OTR/L Acute Rehab Pager: [phone] Office: [phone]    Theodoro Grist Zollie Clemence 12/29/2016, 5:39 PM

## 2016-12-29 NOTE — Progress Notes (Signed)
  Echocardiogram Echocardiogram Transesophageal has been performed.  Janalyn Harder 12/29/2016, 9:01 AM

## 2016-12-30 ENCOUNTER — Encounter (HOSPITAL_COMMUNITY): Payer: Self-pay | Admitting: Cardiology

## 2016-12-30 DIAGNOSIS — I63011 Cerebral infarction due to thrombosis of right vertebral artery: Secondary | ICD-10-CM

## 2016-12-30 DIAGNOSIS — I82431 Acute embolism and thrombosis of right popliteal vein: Secondary | ICD-10-CM

## 2016-12-30 DIAGNOSIS — F17200 Nicotine dependence, unspecified, uncomplicated: Secondary | ICD-10-CM

## 2016-12-30 DIAGNOSIS — I6602 Occlusion and stenosis of left middle cerebral artery: Secondary | ICD-10-CM

## 2016-12-30 DIAGNOSIS — R002 Palpitations: Secondary | ICD-10-CM

## 2016-12-30 DIAGNOSIS — E785 Hyperlipidemia, unspecified: Secondary | ICD-10-CM

## 2016-12-30 DIAGNOSIS — R7303 Prediabetes: Secondary | ICD-10-CM

## 2016-12-30 DIAGNOSIS — I639 Cerebral infarction, unspecified: Secondary | ICD-10-CM

## 2016-12-30 LAB — BASIC METABOLIC PANEL
Anion gap: 10 (ref 5–15)
BUN: 10 mg/dL (ref 6–20)
CALCIUM: 9.4 mg/dL (ref 8.9–10.3)
CHLORIDE: 104 mmol/L (ref 101–111)
CO2: 25 mmol/L (ref 22–32)
CREATININE: 1.17 mg/dL (ref 0.61–1.24)
GFR calc Af Amer: >60 mL/min (ref >60)
GFR calc non Af Amer: >60 mL/min (ref >60)
GLUCOSE: 91 mg/dL (ref 65–99)
Potassium: 3.9 mmol/L (ref 3.5–5.1)
Sodium: 139 mmol/L (ref 135–145)

## 2016-12-30 LAB — BETA-2-GLYCOPROTEIN I ABS, IGG/M/A

## 2016-12-30 LAB — CBC
HEMATOCRIT: 44.1 % (ref 39.0–52.0)
HEMOGLOBIN: 14.1 g/dL (ref 13.0–17.0)
MCH: 26.7 pg (ref 26.0–34.0)
MCHC: 32 g/dL (ref 30.0–36.0)
MCV: 83.5 fL (ref 78.0–100.0)
Platelets: 254 10*3/uL (ref 150–400)
RBC: 5.28 MIL/uL (ref 4.22–5.81)
RDW: 13.9 % (ref 11.5–15.5)
WBC: 7.8 10*3/uL (ref 4.0–10.5)

## 2016-12-30 LAB — PROTEIN C, TOTAL: Protein C, Total: 89 % (ref 60–150)

## 2016-12-30 LAB — CARDIOLIPIN ANTIBODIES, IGG, IGM, IGA
Anticardiolipin IgA: <9 APL U/mL (ref 0–11)
Anticardiolipin IgG: <9 GPL U/mL (ref 0–14)
Anticardiolipin IgM: <9 MPL U/mL (ref 0–12)

## 2016-12-30 LAB — HEPARIN LEVEL (UNFRACTIONATED): Heparin Unfractionated: 0.6 IU/mL (ref 0.30–0.70)

## 2016-12-30 MED ORDER — APIXABAN 5 MG PO TABS: 5.0000 mg | ORAL_TABLET | Freq: Two times a day (BID) | ORAL | 0 refills | Status: DC

## 2016-12-30 MED ORDER — APIXABAN 5 MG PO TABS: 10.0000 mg | ORAL_TABLET | Freq: Two times a day (BID) | ORAL | 0 refills | Status: DC

## 2016-12-30 MED ORDER — APIXABAN 5 MG PO TABS
10.0000 mg | ORAL_TABLET | Freq: Two times a day (BID) | ORAL | Status: DC
  Administered 2016-12-30: 10 mg via ORAL
  Filled 2016-12-30: qty 2

## 2016-12-30 MED ORDER — APIXABAN 5 MG PO TABS: 5.0000 mg | ORAL_TABLET | Freq: Two times a day (BID) | ORAL | Status: DC

## 2016-12-30 MED ORDER — ATORVASTATIN CALCIUM 80 MG PO TABS: 80.0000 mg | ORAL_TABLET | Freq: Every day | ORAL | 0 refills | Status: DC

## 2016-12-30 NOTE — Progress Notes (Signed)
[facility] no longer offers outpatient OT. [facility]. Will updated the patient.

## 2016-12-30 NOTE — Progress Notes (Signed)
Discharge orders received.  Discharge instructions and follow-up appointments reviewed with the patient.  VSS upon discharge.  IV removed and education complete.  Transported out via wheelchair.   Sheridan Gettel M, RN 

## 2016-12-30 NOTE — Care Management Note (Signed)
Case Management Note  Patient Details  Name: [name] MRN: [id number] Date of Birth: 09-04-1975  Subjective/Objective:                    Action/Plan: Pt discharging home with self care. CM consulted for outpatient therapy. CM met with the patient and he was interested in attending Outpatient therapy at [facility]. Orders in EPIC and information on the AVS. Pt discharging on Eliquis. CM provided him a 30 day free card for the Eliquis and $10 co pay card.  Pt has transportation home.    Expected Discharge Date:  12/29/16               Expected Discharge Plan:  OP Rehab  In-House Referral:     Discharge planning Services  CM Consult, Medication Assistance  Post Acute Care Choice:    Choice offered to:     DME Arranged:    DME Agency:     HH Arranged:    HH Agency:     Status of Service:  Completed, signed off  If discussed at [employer] of Stay Meetings, dates discussed:    Additional Comments:  Pollie Friar, RN 12/30/2016, 1:13 PM

## 2016-12-30 NOTE — Progress Notes (Addendum)
ANTICOAGULATION CONSULT NOTE - Follow Up Consult  Pharmacy Consult for heparin Indication: RLE VTE of peroneal veins in setting of stroke  Labs:  Recent Labs  12/27/16 1517  12/29/16 0154 12/29/16 1049 12/30/16 0754  HGB  --   --  12.8*  --  14.1  HCT  --   --  39.2  --  44.1  PLT  --   --  260  --  254  HEPARINUNFRC  --   < > 0.59 0.48 0.60  CREATININE  --   --  1.22  --  1.17  CKTOTAL 235  --   --   --   --   < > = values in this interval not displayed.  Assessment: 41yo male now above goal on heparin for new DVT, also with recent stroke. Heparin level trending up to 0.6. CBC stable. [name] had a little bleeding but not being used so removed with no problems.  Goal of Therapy:  Heparin level 0.3-0.5 units/ml (with recent CVA)   Plan:  Decrease heparin gtt to 1,100 units/hr. Goal 0.3-0.5 (recent CVA) Monitor daily heparin level, CBC, s/s of bleed  ADDENDUM:  Transitioning to Eliquis.  Plan: Stop heparin gtt Start Eliquis 10mg  PO BID x 7 days, then start 5mg  PO BID Monitor CBC, s/s of bleed  [name], PharmD, [name] Clinical Pharmacist Pager [phone] 12/30/2016 9:48 AM

## 2016-12-30 NOTE — Discharge Summary (Signed)
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 CBF (<30%) Volume: 85m Perfusion (Tmax>6.0s) volume: 556m- but not matching the area of symptomatic concern or the CT findings, favored artifactual. Transit time maps do show asymmetric left parietal transit in keeping with stenosis. These results were called by telephone at the time of interpretation on 12/26/2016 at 8:25 pm to Dr. ERKerney Elbe who verbally acknowledged these results. IMPRESSION: 1. Severe left M1 segment stenosis. The other vessels are unremarkable, suggesting non atheromatous cause. 2. No infarct by cerebral perfusion. The low-density on prior CT is likely below the size threshold and maybe subacute. Electronically Signed   By: JoMonte Fantasia.D.   On: 12/26/2016 20:40   Ct Head Code Stroke Wo Contrast  Result Date: 12/26/2016 CLINICAL DATA:  Code stroke.  Right-sided weakness.  Slurred speech. EXAM: CT HEAD WITHOUT CONTRAST TECHNIQUE: Contiguous axial images were obtained from the base of the skull through the vertex without intravenous contrast. COMPARISON:  12/05/2016 FINDINGS: Brain: There is a  small newly seen area of left precentral gyrus gray-white differentiation loss with subjacent streaky low-density in the white matter. No hemorrhage or hydrocephalus. No masslike findings. Vascular: No hyperdense vessel. Skull: No acute or aggressive finding. Calcifications superior  to the left orbit. Sinuses/Orbits: Patchy mucosal thickening in the nasal cavity with some polypoid features. Other: Text page with prelim sent 12/26/2016 at 7:53 pm to Dr. Cheral Marker ASPECTS [facility] Stroke Program Early CT Score) - Ganglionic level infarction (caudate, lentiform nuclei, internal capsule, insula, M1-M3 cortex): 7 - Supraganglionic infarction (M4-M6 cortex): 2 Total score (0-10 with 10 being normal): 9 IMPRESSION: 1. Small cortical infarct in the left precentral gyrus, not seen 12/05/2016. Ischemic changes also seen in the subjacent white matter. ASPECTS is 9. 2. No acute hemorrhage. Electronically Signed   By: Monte Fantasia M.D.   On: 12/26/2016 19:55   Ir Angio Intra Extracran Sel Com Carotid Innominate Bilat Mod Sed  Result Date: 12/29/2016 CLINICAL DATA:  Left cerebral hemispheric TIAs for 2 weeks, and new fixed speech abnormality and right upper extremity weakness. EXAM: BILATERAL COMMON CAROTID AND INNOMINATE ANGIOGRAPHY; IR ANGIO VERTEBRAL SEL VERTEBRAL UNI RIGHT MOD SED; IR ANGIO VERTEBRAL SEL SUBCLAVIAN INNOMINATE UNI LEFT MOD SED COMPARISON:  CT angiogram the head and neck 12/26/2016. MEDICATIONS: Heparin 1500 units IV; no antibiotic was administered within 1 hour of the procedure. ANESTHESIA/SEDATION: Versed 1 mg IV; Fentanyl 75 mcg IV. Hydralazine 10 mg IV for high blood pressure. Moderate Sedation Time:  10 minutes. The patient was continuously monitored during the procedure by the interventional radiology nurse under my direct supervision. CONTRAST:  Isovue 300 approximately 60 mL. FLUOROSCOPY TIME:  Fluoroscopy Time: 10 minutes 6 seconds (550 mGy). COMPLICATIONS: None immediate. TECHNIQUE: Informed  written consent was obtained from the patient after a thorough discussion of the procedural risks, benefits and alternatives. All questions were addressed. Maximal Sterile Barrier Technique was utilized including caps, mask, sterile gowns, sterile gloves, sterile drape, hand hygiene and skin antiseptic. A timeout was performed prior to the initiation of the procedure. The right groin was prepped and draped in the usual sterile fashion. Thereafter using modified Seldinger technique, transfemoral access into the right common femoral artery was obtained without difficulty. Over a 0.035 inch guidewire, a 5 French Pinnacle sheath was inserted. Through this, and also over 0.035 inch guidewire, a 5 [place] JB 1 catheter was advanced to the aortic arch region and selectively positioned in the right common carotid artery,, the right vertebral artery, the left common carotid artery and the left vertebral artery. FINDINGS: The right vertebral origin is widely patent. The vessel is seen to opacify normally to the cranial skull base. Normal opacification is seen of the right posterior-inferior cerebellar artery and the right vertebrobasilar junction. There are mild focal areas of caliber irregularity of the mid 1/3 of the right posterior-inferior cerebellar artery. Distally the basilar artery, the posterior cerebral arteries, the superior cerebellar arteries and the anterior-inferior cerebellar arteries opacify normally into the capillary and venous phases. Non-opacified blood is seen in the basilar artery from the contralateral vertebral artery. The right posterior-inferior cerebellar artery supplies the contralateral PICA distribution as well. The right common carotid arteriogram demonstrates the right external carotid artery and its major branches to be widely patent. The right internal carotid artery at the bulb to the cranial skull base opacifies normally. The petrous cavernous junction is widely patent. There is mild  eccentric prominence of the junction of the caval cavernous in the proximal cavernous segment of the right internal carotid artery. Distally the right internal carotid supraclinoid segment is widely patent. The right middle cerebral artery M1 segment demonstrates mild prominence in its distal 1/3. There is a suggestion of a mild stenosis of the right middle cerebral  artery at the bifurcation junction. More distally, the MCA distribution and the right anterior cerebral artery opacify normally into the capillary and venous phases. The left vertebral artery origin demonstrates approximately 50% stenosis at its origin. More distally, the vessel is seen to opacify normally to the cranial skull base. No discrete left posterior-inferior cerebellar artery is identified on this injection. The basilar artery, the posterior cerebral arteries, the superior cerebellar arteries and the anterior-inferior cerebellar arteries opacify normally into the capillary and venous phases. The left common carotid arteriogram demonstrates the left external carotid artery and its major branches to be widely patent. The left internal carotid artery at the bulb to the cranial skull base opacifies normally. The petrous, the cavernous segment and the supraclinoid segments are widely patent. The left anterior cerebral artery opacifies into the capillary and venous phases. There is transient cross-filling via the anterior communicating artery of the right anterior cerebral artery. The left middle cerebral artery in its distal half demonstrates diffuse tapered narrowing which terminates proximal to the left MCA trifurcation. The magnified obliques sequences demonstrate the stenosis to be pre occlusive 95% plus with flow demonstrated into the superior and inferior divisions of the left middle cerebral artery. 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 Slight hemodynamic slow flow noted  distal to the severe stenosis. Approximately 50% stenosis of the left vertebral artery at its origin. Dominant right posterior-inferior cerebellar artery supplying both the right and left PICA distributions. 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 Mild stenosis of the right middle cerebral artery proximal to the bifurcation suspicious of intracranial arteriosclerosis. PLAN: Findings were reviewed with the patient and the patient's wife. Endovascular revascularization of the symptomatic pre occlusive left M1 stenosis was an option discussed with the patient and the wife to prevent complete occlusion with possible devastating neurologic deficits. Electronically Signed   By: Luanne Bras M.D.   On: 12/28/2016 17:11   Ir Angio Vertebral Sel Vertebral Bilat Mod Sed  Result Date: 12/29/2016 CLINICAL DATA:  Left cerebral hemispheric TIAs for 2 weeks, and new fixed speech abnormality and right upper extremity weakness. EXAM: BILATERAL COMMON CAROTID AND INNOMINATE ANGIOGRAPHY; IR ANGIO VERTEBRAL SEL VERTEBRAL UNI RIGHT MOD SED; IR ANGIO VERTEBRAL SEL SUBCLAVIAN INNOMINATE UNI LEFT MOD SED COMPARISON:  CT angiogram the head and neck 12/26/2016. MEDICATIONS: Heparin 1500 units IV; no antibiotic was administered within 1 hour of the procedure. ANESTHESIA/SEDATION: Versed 1 mg IV; Fentanyl 75 mcg IV. Hydralazine 10 mg IV for high blood pressure. Moderate Sedation Time:  10 minutes. The patient was continuously monitored during the procedure by the interventional radiology nurse under my direct supervision. CONTRAST:  Isovue 300 approximately 60 mL. FLUOROSCOPY TIME:  Fluoroscopy Time: 10 minutes 6 seconds (550 mGy). COMPLICATIONS: None immediate. TECHNIQUE: Informed written consent was obtained from the patient after a thorough discussion of the procedural risks, benefits and alternatives. All questions were addressed. Maximal Sterile Barrier Technique was utilized  including caps, mask, sterile gowns, sterile gloves, sterile drape, hand hygiene and skin antiseptic. A timeout was performed prior to the initiation of the procedure. The right groin was prepped and draped in the usual sterile fashion. Thereafter using modified Seldinger technique, transfemoral access into the right common femoral artery was obtained without difficulty. Over a 0.035 inch guidewire, a 5 French Pinnacle sheath was inserted. Through this, and also over 0.035 inch guidewire, a 5 [place] JB 1 catheter was advanced to the aortic arch region  and selectively positioned in the right common carotid artery,, the right vertebral artery, the left common carotid artery and the left vertebral artery. FINDINGS: The right vertebral origin is widely patent. The vessel is seen to opacify normally to the cranial skull base. Normal opacification is seen of the right posterior-inferior cerebellar artery and the right vertebrobasilar junction. There are mild focal areas of caliber irregularity of the mid 1/3 of the right posterior-inferior cerebellar artery. Distally the basilar artery, the posterior cerebral arteries, the superior cerebellar arteries and the anterior-inferior cerebellar arteries opacify normally into the capillary and venous phases. Non-opacified blood is seen in the basilar artery from the contralateral vertebral artery. The right posterior-inferior cerebellar artery supplies the contralateral PICA distribution as well. The right common carotid arteriogram demonstrates the right external carotid artery and its major branches to be widely patent. The right internal carotid artery at the bulb to the cranial skull base opacifies normally. The petrous cavernous junction is widely patent. There is mild eccentric prominence of the junction of the caval cavernous in the proximal cavernous segment of the right internal carotid artery. Distally the right internal carotid supraclinoid segment is widely patent.  The right middle cerebral artery M1 segment demonstrates mild prominence in its distal 1/3. There is a suggestion of a mild stenosis of the right middle cerebral artery at the bifurcation junction. More distally, the MCA distribution and the right anterior cerebral artery opacify normally into the capillary and venous phases. The left vertebral artery origin demonstrates approximately 50% stenosis at its origin. More distally, the vessel is seen to opacify normally to the cranial skull base. No discrete left posterior-inferior cerebellar artery is identified on this injection. The basilar artery, the posterior cerebral arteries, the superior cerebellar arteries and the anterior-inferior cerebellar arteries opacify normally into the capillary and venous phases. The left common carotid arteriogram demonstrates the left external carotid artery and its major branches to be widely patent. The left internal carotid artery at the bulb to the cranial skull base opacifies normally. The petrous, the cavernous segment and the supraclinoid segments are widely patent. The left anterior cerebral artery opacifies into the capillary and venous phases. There is transient cross-filling via the anterior communicating artery of the right anterior cerebral artery. The left middle cerebral artery in its distal half demonstrates diffuse tapered narrowing which terminates proximal to the left MCA trifurcation. The magnified obliques sequences demonstrate the stenosis to be pre occlusive 95% plus with flow demonstrated into the superior and inferior divisions of the left middle cerebral artery. 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 Slight hemodynamic slow flow noted distal to the severe stenosis. Approximately 50% stenosis of the left vertebral artery at its origin. Dominant right posterior-inferior cerebellar artery supplying both the right and left PICA  distributions. 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 Mild stenosis of the right middle cerebral artery proximal to the bifurcation suspicious of intracranial arteriosclerosis. PLAN: Findings were reviewed with the patient and the patient's wife. Endovascular revascularization of the symptomatic pre occlusive left M1 stenosis was an option discussed with the patient and the wife to prevent complete occlusion with possible devastating neurologic deficits. Electronically Signed   By: Luanne Bras M.D.   On: 12/28/2016 17:11    TCD Bubble Study -No apparent PFO  TEE Normal LV size with moderate LV hypertrophy.  EF 60-65%.  Normal wall motion.  Normal RV size and systolic function.  Normal left  atrial size with no LA appendage thrombus.  Normal right atrium.  There was no PFO or ASD by bubble study or color doppler.  No significant mitral regurgitation.  Trileaflet aortic valve with no stenosis or regurgitation.  No significant TR.  Normal caliber aorta with no significant plaque.    No source of embolus.  Subjective: Seen and examined at bedside and was doing ok. No CP or SOB. Still had some Right arm weakness. No other concerns or complaints and ready to go home.  Discharge Exam: Vitals:   12/30/16 1038 12/30/16 1411  BP: (!) 142/72 137/85  Pulse: 79 68  Resp: 18 18  Temp: 98.7 F (37.1 C)   SpO2: 100% 100%   Vitals:   12/30/16 0542 12/30/16 0815 12/30/16 1038 12/30/16 1411  BP: 126/87 139/86 (!) 142/72 137/85  Pulse: 76 75 79 68  Resp: '18 20 18 18  ' Temp: 98.2 F (36.8 C) 98.6 F (37 C) 98.7 F (37.1 C)   TempSrc: Oral Oral Oral Oral  SpO2: 100% 98% 100% 100%  Weight:      Height:       General: Pt is alert, awake, not in acute distress Cardiovascular: RRR, S1/S2 +, no rubs, no gallops Respiratory: CTA bilaterally, no wheezing, no rhonchi Abdominal: Soft, NT, ND, bowel sounds + Extremities: no edema, no cyanosis; Right  Upper Arm weakness  The results of significant diagnostics from this hospitalization (including imaging, microbiology, ancillary and laboratory) are listed below for reference.    Microbiology: No results found for this or any previous visit (from the past 240 hour(s)).   Labs: BNP (last 3 results) No results for input(s): BNP in the last 8760 hours. Basic Metabolic Panel:  Recent Labs Lab 12/26/16 1947 12/26/16 1957 12/27/16 0200 12/29/16 0154 12/30/16 0754  NA 138 140 139 137 139  K 3.6 3.7 3.4* 3.8 3.9  CL 105 104 105 104 104  CO2 24  --  '26 24 25  ' GLUCOSE 107* 106* 127* 115* 91  BUN '8 12 9 16 10  ' CREATININE 1.29* 1.20 1.20 1.22 1.17  CALCIUM 9.1  --  8.8* 8.9 9.4   Liver Function Tests:  Recent Labs Lab 12/26/16 1947 12/27/16 0200  AST 24 21  ALT 27 24  ALKPHOS 85 76  BILITOT 0.5 0.7  PROT 7.3 6.3*  ALBUMIN 4.1 3.5   No results for input(s): LIPASE, AMYLASE in the last 168 hours. No results for input(s): AMMONIA in the last 168 hours. CBC:  Recent Labs Lab 12/26/16 1947 12/26/16 1957 12/27/16 0200 12/29/16 0154 12/30/16 0754  WBC 9.5  --  7.4 10.5 7.8  NEUTROABS 6.1  --   --   --   --   HGB 13.7 15.0 12.6* 12.8* 14.1  HCT 41.1 44.0 37.7* 39.2 44.1  MCV 80.9  --  80.9 82.9 83.5  PLT 250  --  249 260 254   Cardiac Enzymes:  Recent Labs Lab 12/27/16 1517  CKTOTAL 235   BNP: Invalid input(s): POCBNP CBG: No results for input(s): GLUCAP in the last 168 hours. D-Dimer No results for input(s): DDIMER in the last 72 hours. Hgb A1c No results for input(s): HGBA1C in the last 72 hours. Lipid Profile No results for input(s): CHOL, HDL, LDLCALC, TRIG, CHOLHDL, LDLDIRECT in the last 72 hours. Thyroid function studies  Recent Labs  12/28/16 1819  TSH 4.385   Anemia work up No results for input(s): VITAMINB12, FOLATE, FERRITIN, TIBC, IRON, RETICCTPCT in the last 72  hours. Urinalysis    Component Value Date/Time   COLORURINE YELLOW  12/27/2016 0820   APPEARANCEUR CLEAR 12/27/2016 0820   LABSPEC 1.030 12/27/2016 0820   PHURINE 6.0 12/27/2016 0820   GLUCOSEU NEGATIVE 12/27/2016 0820   HGBUR NEGATIVE 12/27/2016 0820   BILIRUBINUR NEGATIVE 12/27/2016 0820   KETONESUR NEGATIVE 12/27/2016 0820   PROTEINUR NEGATIVE 12/27/2016 0820   UROBILINOGEN 1.0 01/09/2014 2352   NITRITE NEGATIVE 12/27/2016 0820   LEUKOCYTESUR NEGATIVE 12/27/2016 0820   Sepsis Labs Invalid input(s): PROCALCITONIN,  WBC,  LACTICIDVEN Microbiology No results found for this or any previous visit (from the past 240 hour(s)).  Time coordinating discharge: 35 minutes  SIGNED:  Kerney Elbe, DO [facility] 12/30/2016, 2:19 PM Pager [phone]  If 7PM-7AM, please contact night-coverage [email or web address] Password [email or web address]

## 2016-12-31 ENCOUNTER — Ambulatory Visit (INDEPENDENT_AMBULATORY_CARE_PROVIDER_SITE_OTHER): Payer: BLUE CROSS/BLUE SHIELD | Admitting: Family Medicine

## 2016-12-31 ENCOUNTER — Other Ambulatory Visit: Payer: Self-pay | Admitting: Family Medicine

## 2016-12-31 ENCOUNTER — Encounter: Payer: Self-pay | Admitting: Family Medicine

## 2016-12-31 VITALS — BP 104/72 | HR 87 | Temp 98.0°F | Ht 71.0 in | Wt 222.0 lb

## 2016-12-31 DIAGNOSIS — I639 Cerebral infarction, unspecified: Secondary | ICD-10-CM

## 2016-12-31 DIAGNOSIS — I69328 Other speech and language deficits following cerebral infarction: Secondary | ICD-10-CM | POA: Diagnosis not present

## 2016-12-31 LAB — COMPREHENSIVE METABOLIC PANEL
ALBUMIN: 4.6 g/dL (ref 3.5–5.2)
ALT: 30 U/L (ref 0–53)
AST: 26 U/L (ref 0–37)
Alkaline Phosphatase: 90 U/L (ref 39–117)
BUN: 14 mg/dL (ref 6–23)
CALCIUM: 9.9 mg/dL (ref 8.4–10.5)
CHLORIDE: 101 meq/L (ref 96–112)
CO2: 29 mEq/L (ref 19–32)
Creatinine, Ser: 1.09 mg/dL (ref 0.40–1.50)
GFR: 95.69 mL/min (ref >60.00)
Glucose, Bld: 93 mg/dL (ref 70–99)
POTASSIUM: 3.6 meq/L (ref 3.5–5.1)
SODIUM: 138 meq/L (ref 135–145)
Total Bilirubin: 0.7 mg/dL (ref 0.2–1.2)
Total Protein: 8.1 g/dL (ref 6.0–8.3)

## 2016-12-31 LAB — PHOSPHORUS: PHOSPHORUS: 4.1 mg/dL (ref 2.3–4.6)

## 2016-12-31 LAB — MAGNESIUM: MAGNESIUM: 2 mg/dL (ref 1.5–2.5)

## 2016-12-31 LAB — CBC
HEMATOCRIT: 43 % (ref 39.0–52.0)
HEMOGLOBIN: 13.8 g/dL (ref 13.0–17.0)
MCHC: 32.2 g/dL (ref 30.0–36.0)
MCV: 84.4 fl (ref 78.0–100.0)
Platelets: 276 10*3/uL (ref 150.0–400.0)
RBC: 5.1 Mil/uL (ref 4.22–5.81)
RDW: 14.2 % (ref 11.5–15.5)
WBC: 7.7 10*3/uL (ref 4.0–10.5)

## 2016-12-31 LAB — CRYOGLOBULIN

## 2016-12-31 MED ORDER — APIXABAN 5 MG PO TABS: 10.0000 mg | ORAL_TABLET | Freq: Two times a day (BID) | ORAL | 0 refills | Status: DC
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
## 2016-12-31 NOTE — Progress Notes (Signed)
Pre visit review using our clinic review tool, if applicable. No additional management support is needed unless otherwise documented below in the visit note. 

## 2016-12-31 NOTE — Patient Instructions (Addendum)
Call Dr. Warren DanesXu's office for an appointment for hospitalization follow up for stroke. His office's number is [phone].  The outpatient rehab team will be contacting you. Wait for a call. If no one contacts you in the next 1-2 weeks, let me know.  OK to take atorvastatin during the day, as long as it is around the same time daily.  Eliquis: continue 2 tabs twice daily (total of 4 per day) until you run out. This should be a 1 week course. After you finish the 1 week course (around 01/06/17), you will star taking Eliquis 5 mg 1 tab twice daily (instead of 2 tabs twice daily).

## 2017-01-01 ENCOUNTER — Other Ambulatory Visit: Payer: Self-pay

## 2017-01-01 LAB — PROTHROMBIN GENE MUTATION

## 2017-01-01 LAB — FACTOR 5 LEIDEN

## 2017-01-01 NOTE — Patient Outreach (Signed)
[facility] Care Management  01/01/2017  Rudean HaskellJames E Lindsey 01/23/1976 [id number]     EMMI-STROKE RED ON EMMI ALERT Day # 1 Date: 12/31/16 Red Alert Reason: "Questions/problems with meds? Yes   Scheduled a follow up appt? I don't know"   Outreach attempt # 1 to patient. Spoke with patient who states he is doing fairly well since discharge home. He voices that he went to see PCP for f/u on yesterday. RN CM reviewed and addressed red alerts with patient. He reports that he had an issue with one of his prescriptions but the MD fixed that on yesterday. He has since picked med up from the pharmacy. He voices no further issues or concerns regarding his meds. He has called neuro office to make an appt and is waiting a call back with appt date and time. He reports that he has transportation to appts. RN CM reviewed with patient s/s of stroke and worsening condition. Patient able to verbalize correctly s/s of stroke and when to seek medical attention. No further RN CM needs or concerns at this time. Advised patient that they would continue to get automated EMMI-Stroke post discharge calls to assess how they are doing following recent hospitalization and will receive a call from a nurse if any of their responses were abnormal. Patient voiced understanding and was appreciative of f/u call.     Plan: RN CM will notify [facility] administrative assistant of case status.   Antionette Fairyoshanda Iman Reinertsen, RN,BSN,CCM [facility] Care Management Telephonic Care Management Coordinator Direct Phone: [phone] Toll Free: [phone] Fax: [phone]

## 2017-01-07 ENCOUNTER — Other Ambulatory Visit (HOSPITAL_COMMUNITY): Payer: Self-pay | Admitting: Interventional Radiology

## 2017-01-07 DIAGNOSIS — I771 Stricture of artery: Secondary | ICD-10-CM

## 2017-01-08 ENCOUNTER — Other Ambulatory Visit (HOSPITAL_COMMUNITY): Payer: Self-pay | Admitting: Interventional Radiology

## 2017-01-08 DIAGNOSIS — I771 Stricture of artery: Secondary | ICD-10-CM

## 2017-01-12 ENCOUNTER — Ambulatory Visit (HOSPITAL_COMMUNITY)
Admission: RE | Admit: 2017-01-12 | Discharge: 2017-01-12 | Disposition: A | Discharge status: Home / Self Care | Payer: BLUE CROSS/BLUE SHIELD | Source: Ambulatory Visit | Attending: Interventional Radiology | Admitting: Interventional Radiology

## 2017-01-12 DIAGNOSIS — I771 Stricture of artery: Secondary | ICD-10-CM

## 2017-01-12 HISTORY — PX: IR RADIOLOGIST EVAL & MGMT: IMG5224

## 2017-01-13 ENCOUNTER — Ambulatory Visit (HOSPITAL_COMMUNITY): Payer: BLUE CROSS/BLUE SHIELD

## 2017-01-13 ENCOUNTER — Encounter (HOSPITAL_COMMUNITY): Payer: Self-pay | Admitting: Interventional Radiology

## 2017-01-18 NOTE — Pre-Procedure Instructions (Signed)
[name]  01/18/2017      [id number] - [place], [place] - [address] AT [facility] [address] [place] [address] Phone: [phone] Fax: [phone]  [id number] - [place], [place] - [address] AT [address] [place] [address] Phone: [phone] Fax: [phone]    Your procedure is scheduled on January 21, 2017.  Report to [facility] Admitting at 06:15 A.M.  Call this number if you have problems the morning of surgery:  [phone]   Remember:  Do not eat food or drink liquids after midnight.   Take these medicines the morning of surgery with A SIP OF WATER:  Plavix and Aspirin as directed by your Doctor.  Please bring your Albuterol inhaler and Fluticasone (Flovent) inhaler with you to hospital.   Ask you doctor about Eliquis prior to surgery.  As of today, STOP taking any Aleve, Naproxen, Ibuprofen, Motrin, Advil, Goody's, BC's, all herbal medications, fish oil, and all vitamins.   Do not wear jewelry, make-up or nail polish.  Do not wear lotions, powders, or perfumes, or deodorant.  Do not shave 48 hours prior to surgery.  Men may shave face and neck.  Do not bring valuables to the hospital.   [facility] is not responsible for any belongings or valuables.  Contacts, dentures or bridgework may not be worn into surgery.  Leave your suitcase in the car.  After surgery it may be brought to your room.  For patients admitted to the hospital, discharge time will be determined by your treatment team.  Patients discharged the day of surgery will not be allowed to drive home.   Special instructions:   [facility]- Preparing For Surgery  Before surgery, you can play an important role. Because skin is not sterile, your skin needs to be as free of germs as possible. You can reduce the number of germs on your skin by washing with CHG (chlorahexidine  gluconate) Soap before surgery.  CHG is an antiseptic cleaner which kills germs and bonds with the skin to continue killing germs even after washing.  Please do not use if you have an allergy to CHG or antibacterial soaps. If your skin becomes reddened/irritated stop using the CHG.  Do not shave (including legs and underarms) for at least 48 hours prior to first CHG shower. It is OK to shave your face.  Please follow these instructions carefully.   1. Shower the NIGHT BEFORE SURGERY and the MORNING OF SURGERY with CHG.   2. If you chose to wash your hair, wash your hair first as usual with your normal shampoo.  3. After you shampoo, rinse your hair and body thoroughly to remove the shampoo.  4. Use CHG as you would any other liquid soap. You can apply CHG directly to the skin and wash gently with a scrungie or a clean washcloth.   5. Apply the CHG Soap to your body ONLY FROM THE NECK DOWN.  Do not use on open wounds or open sores. Avoid contact with your eyes, ears, mouth and genitals (private parts). Wash genitals (private parts) with your normal soap.  6. Wash thoroughly, paying special attention to the area where your surgery will be performed.  7. Thoroughly rinse your body with warm water from the neck down.  8. DO NOT shower/wash with your normal soap after using and  rinsing off the CHG Soap.  9. Pat yourself dry with a CLEAN TOWEL.   10. Wear CLEAN PAJAMAS   11. Place CLEAN SHEETS on your bed the night of your first shower and DO NOT SLEEP WITH PETS.    Day of Surgery: Do not apply any deodorants/lotions. Please wear clean clothes to the hospital/surgery center.      Please read over the following fact sheets that you were given.

## 2017-01-19 ENCOUNTER — Other Ambulatory Visit: Payer: Self-pay | Admitting: Radiology

## 2017-01-19 ENCOUNTER — Other Ambulatory Visit: Payer: Self-pay

## 2017-01-19 ENCOUNTER — Encounter (HOSPITAL_COMMUNITY)
Admission: RE | Admit: 2017-01-19 | Discharge: 2017-01-19 | Disposition: A | Discharge status: Home / Self Care | Payer: BLUE CROSS/BLUE SHIELD | Source: Ambulatory Visit | Attending: Interventional Radiology | Admitting: Interventional Radiology

## 2017-01-19 ENCOUNTER — Encounter (HOSPITAL_COMMUNITY): Payer: Self-pay

## 2017-01-19 DIAGNOSIS — I739 Peripheral vascular disease, unspecified: Secondary | ICD-10-CM | POA: Diagnosis not present

## 2017-01-19 DIAGNOSIS — I10 Essential (primary) hypertension: Secondary | ICD-10-CM | POA: Diagnosis not present

## 2017-01-19 DIAGNOSIS — Z87891 Personal history of nicotine dependence: Secondary | ICD-10-CM | POA: Diagnosis not present

## 2017-01-19 DIAGNOSIS — I69320 Aphasia following cerebral infarction: Secondary | ICD-10-CM | POA: Diagnosis not present

## 2017-01-19 DIAGNOSIS — I6602 Occlusion and stenosis of left middle cerebral artery: Secondary | ICD-10-CM | POA: Diagnosis present

## 2017-01-19 DIAGNOSIS — E785 Hyperlipidemia, unspecified: Secondary | ICD-10-CM | POA: Diagnosis not present

## 2017-01-19 DIAGNOSIS — I69331 Monoplegia of upper limb following cerebral infarction affecting right dominant side: Secondary | ICD-10-CM | POA: Diagnosis not present

## 2017-01-19 DIAGNOSIS — Z7951 Long term (current) use of inhaled steroids: Secondary | ICD-10-CM | POA: Diagnosis not present

## 2017-01-19 DIAGNOSIS — Z7901 Long term (current) use of anticoagulants: Secondary | ICD-10-CM | POA: Diagnosis not present

## 2017-01-19 DIAGNOSIS — I6502 Occlusion and stenosis of left vertebral artery: Secondary | ICD-10-CM | POA: Diagnosis not present

## 2017-01-19 DIAGNOSIS — Z7982 Long term (current) use of aspirin: Secondary | ICD-10-CM | POA: Diagnosis not present

## 2017-01-19 DIAGNOSIS — J45909 Unspecified asthma, uncomplicated: Secondary | ICD-10-CM | POA: Diagnosis not present

## 2017-01-19 DIAGNOSIS — Z79899 Other long term (current) drug therapy: Secondary | ICD-10-CM | POA: Diagnosis not present

## 2017-01-19 DIAGNOSIS — Z7902 Long term (current) use of antithrombotics/antiplatelets: Secondary | ICD-10-CM | POA: Diagnosis not present

## 2017-01-19 HISTORY — DX: Cerebral infarction, unspecified: I63.9

## 2017-01-19 LAB — BASIC METABOLIC PANEL
Anion gap: 11 (ref 5–15)
BUN: 12 mg/dL (ref 6–20)
CALCIUM: 9.2 mg/dL (ref 8.9–10.3)
CO2: 21 mmol/L — ABNORMAL LOW (ref 22–32)
CREATININE: 1.14 mg/dL (ref 0.61–1.24)
Chloride: 106 mmol/L (ref 101–111)
GFR calc Af Amer: >60 mL/min (ref >60)
GLUCOSE: 99 mg/dL (ref 65–99)
Potassium: 4.3 mmol/L (ref 3.5–5.1)
SODIUM: 138 mmol/L (ref 135–145)

## 2017-01-19 LAB — PROTIME-INR
INR: 0.92
PROTHROMBIN TIME: 12.3 s (ref 11.4–15.2)

## 2017-01-19 NOTE — Progress Notes (Signed)
PCP - Dr. Carmelia Roller  Cardiologist - Denies  Chest x-ray - 12/27/16 (E)  EKG - 01/19/17  Stress Test - Denies  ECHO - 12/29/16 (E)  Cardiac Cath - Denies  Sleep Study - No CPAP - None  Chart will be given to anesthesia for review due to medical history.  Pt denies having chest pain, sob, or fever at this time. All instructions explained to the pt, with a verbal understanding of the material. Pt agrees to go over the instructions while at home for a better understanding. The opportunity to ask questions was provided.

## 2017-01-19 NOTE — Progress Notes (Signed)
Per the lab, the CBC could not be ran due to insufficient amount of blood in the tube. Will reorder for DOS.
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
## 2017-01-20 NOTE — Anesthesia Preprocedure Evaluation (Addendum)
Anesthesia Evaluation  Patient identified by MRN, date of birth, ID band Patient awake    Reviewed: Allergy & Precautions, NPO status , Patient's Chart, lab work & pertinent test results  Airway Mallampati: III  TM Distance: >3 FB Neck ROM: Full    Dental  (+) Dental Advisory Given   Pulmonary asthma , former smoker,    breath sounds clear to auscultation       Cardiovascular hypertension, Pt. on medications + Peripheral Vascular Disease   Rhythm:Regular Rate:Normal     Neuro/Psych CVA    GI/Hepatic Neg liver ROS, GERD  ,  Endo/Other  negative endocrine ROS  Renal/GU negative Renal ROS     Musculoskeletal   Abdominal   Peds  Hematology negative hematology ROS (+)   Anesthesia Other Findings   Reproductive/Obstetrics                            Lab Results  Component Value Date   WBC 7.7 12/31/2016   HGB 13.8 12/31/2016   HCT 43.0 12/31/2016   MCV 84.4 12/31/2016   PLT 276.0 12/31/2016   Lab Results  Component Value Date   CREATININE 1.14 01/19/2017   BUN 12 01/19/2017   NA 138 01/19/2017   K 4.3 01/19/2017   CL 106 01/19/2017   CO2 21 (L) 01/19/2017    Anesthesia Physical Anesthesia Plan  ASA: III  Anesthesia Plan: General   Post-op Pain Management:    Induction: Intravenous  PONV Risk Score and Plan: 2 and Ondansetron, Dexamethasone and Treatment may vary due to age or medical condition  Airway Management Planned: Oral ETT  Additional Equipment: Arterial line  Intra-op Plan:   Post-operative Plan: Extubation in OR  Informed Consent: I have reviewed the patients History and Physical, chart, labs and discussed the procedure including the risks, benefits and alternatives for the proposed anesthesia with the patient or authorized representative who has indicated his/her understanding and acceptance.   Dental advisory given  Plan Discussed with: CRNA  Anesthesia  Plan Comments:        Anesthesia Quick Evaluation

## 2017-01-21 ENCOUNTER — Encounter (HOSPITAL_COMMUNITY)
Admission: RE | Disposition: A | Discharge status: Home / Self Care | Payer: Self-pay | Source: Ambulatory Visit | Attending: Interventional Radiology

## 2017-01-21 ENCOUNTER — Inpatient Hospital Stay (HOSPITAL_COMMUNITY): Payer: BLUE CROSS/BLUE SHIELD | Admitting: Anesthesiology

## 2017-01-21 ENCOUNTER — Ambulatory Visit (HOSPITAL_COMMUNITY)
Admission: RE | Admit: 2017-01-21 | Discharge: 2017-01-21 | Disposition: A | Discharge status: Home / Self Care | Payer: BLUE CROSS/BLUE SHIELD | Source: Ambulatory Visit | Attending: Interventional Radiology | Admitting: Interventional Radiology

## 2017-01-21 ENCOUNTER — Observation Stay (HOSPITAL_COMMUNITY)
Admission: RE | Admit: 2017-01-21 | Discharge: 2017-01-22 | Disposition: A | Discharge status: Home / Self Care | Payer: BLUE CROSS/BLUE SHIELD | Source: Ambulatory Visit | Attending: Interventional Radiology | Admitting: Interventional Radiology

## 2017-01-21 ENCOUNTER — Encounter (HOSPITAL_COMMUNITY): Payer: Self-pay | Admitting: *Deleted

## 2017-01-21 ENCOUNTER — Inpatient Hospital Stay (HOSPITAL_COMMUNITY): Payer: BLUE CROSS/BLUE SHIELD | Admitting: Vascular Surgery

## 2017-01-21 DIAGNOSIS — I6602 Occlusion and stenosis of left middle cerebral artery: Secondary | ICD-10-CM

## 2017-01-21 DIAGNOSIS — I69320 Aphasia following cerebral infarction: Secondary | ICD-10-CM | POA: Insufficient documentation

## 2017-01-21 DIAGNOSIS — Z7901 Long term (current) use of anticoagulants: Secondary | ICD-10-CM | POA: Insufficient documentation

## 2017-01-21 DIAGNOSIS — Z7902 Long term (current) use of antithrombotics/antiplatelets: Secondary | ICD-10-CM | POA: Insufficient documentation

## 2017-01-21 DIAGNOSIS — I6502 Occlusion and stenosis of left vertebral artery: Secondary | ICD-10-CM | POA: Insufficient documentation

## 2017-01-21 DIAGNOSIS — Z87891 Personal history of nicotine dependence: Secondary | ICD-10-CM | POA: Insufficient documentation

## 2017-01-21 DIAGNOSIS — Z7982 Long term (current) use of aspirin: Secondary | ICD-10-CM | POA: Insufficient documentation

## 2017-01-21 DIAGNOSIS — Z7951 Long term (current) use of inhaled steroids: Secondary | ICD-10-CM | POA: Insufficient documentation

## 2017-01-21 DIAGNOSIS — I10 Essential (primary) hypertension: Secondary | ICD-10-CM | POA: Insufficient documentation

## 2017-01-21 DIAGNOSIS — I771 Stricture of artery: Secondary | ICD-10-CM

## 2017-01-21 DIAGNOSIS — E785 Hyperlipidemia, unspecified: Secondary | ICD-10-CM | POA: Insufficient documentation

## 2017-01-21 DIAGNOSIS — I739 Peripheral vascular disease, unspecified: Secondary | ICD-10-CM | POA: Insufficient documentation

## 2017-01-21 DIAGNOSIS — J45909 Unspecified asthma, uncomplicated: Secondary | ICD-10-CM | POA: Insufficient documentation

## 2017-01-21 DIAGNOSIS — I69331 Monoplegia of upper limb following cerebral infarction affecting right dominant side: Secondary | ICD-10-CM | POA: Insufficient documentation

## 2017-01-21 DIAGNOSIS — Z79899 Other long term (current) drug therapy: Secondary | ICD-10-CM | POA: Insufficient documentation

## 2017-01-21 HISTORY — PX: RADIOLOGY WITH ANESTHESIA: SHX6223

## 2017-01-21 HISTORY — PX: IR PTA INTRACRANIAL: IMG2344

## 2017-01-21 LAB — POCT ACTIVATED CLOTTING TIME
Activated Clotting Time: 175 seconds
Activated Clotting Time: 186 seconds
Activated Clotting Time: 169 seconds

## 2017-01-21 LAB — PROTIME-INR
INR: 0.98
Prothrombin Time: 12.9 seconds (ref 11.4–15.2)

## 2017-01-21 LAB — HEPARIN LEVEL (UNFRACTIONATED): HEPARIN UNFRACTIONATED: 0.23 [IU]/mL — AB (ref 0.30–0.70)

## 2017-01-21 LAB — CBC WITH DIFFERENTIAL/PLATELET
BASOS ABS: 0 10*3/uL (ref 0.0–0.1)
BASOS PCT: 0 %
EOS ABS: 0.2 10*3/uL (ref 0.0–0.7)
Eosinophils Relative: 3 %
HEMATOCRIT: 37.6 % — AB (ref 39.0–52.0)
HEMOGLOBIN: 12.2 g/dL — AB (ref 13.0–17.0)
Lymphocytes Relative: 36 %
Lymphs Abs: 2.7 10*3/uL (ref 0.7–4.0)
MCH: 26.6 pg (ref 26.0–34.0)
MCHC: 32.4 g/dL (ref 30.0–36.0)
MCV: 82.1 fL (ref 78.0–100.0)
MONOS PCT: 7 %
Monocytes Absolute: 0.5 10*3/uL (ref 0.1–1.0)
NEUTROS ABS: 4.1 10*3/uL (ref 1.7–7.7)
NEUTROS PCT: 54 %
Platelets: 309 10*3/uL (ref 150–400)
RBC: 4.58 MIL/uL (ref 4.22–5.81)
RDW: 13.2 % (ref 11.5–15.5)
WBC: 7.5 10*3/uL (ref 4.0–10.5)

## 2017-01-21 LAB — APTT: APTT: 29 s (ref 24–36)

## 2017-01-21 LAB — COMPREHENSIVE METABOLIC PANEL
ALBUMIN: 3.6 g/dL (ref 3.5–5.0)
ALK PHOS: 92 U/L (ref 38–126)
ALT: 33 U/L (ref 17–63)
ANION GAP: 10 (ref 5–15)
AST: 22 U/L (ref 15–41)
BILIRUBIN TOTAL: 0.1 mg/dL — AB (ref 0.3–1.2)
BUN: 16 mg/dL (ref 6–20)
CALCIUM: 9.1 mg/dL (ref 8.9–10.3)
CO2: 23 mmol/L (ref 22–32)
Chloride: 105 mmol/L (ref 101–111)
Creatinine, Ser: 1.15 mg/dL (ref 0.61–1.24)
GFR calc Af Amer: >60 mL/min (ref >60)
GFR calc non Af Amer: >60 mL/min (ref >60)
GLUCOSE: 100 mg/dL — AB (ref 65–99)
Potassium: 3.7 mmol/L (ref 3.5–5.1)
SODIUM: 138 mmol/L (ref 135–145)
TOTAL PROTEIN: 6.8 g/dL (ref 6.5–8.1)

## 2017-01-21 LAB — MRSA PCR SCREENING: MRSA BY PCR: NEGATIVE

## 2017-01-21 LAB — PLATELET INHIBITION P2Y12: Platelet Function  P2Y12: 81 [PRU] — ABNORMAL LOW (ref 194–418)

## 2017-01-21 SURGERY — RADIOLOGY WITH ANESTHESIA
Anesthesia: General

## 2017-01-21 MED ORDER — PHENYLEPHRINE HCL 10 MG/ML IJ SOLN
INTRAVENOUS | Status: DC | PRN
  Administered 2017-01-21: 20 ug/min via INTRAVENOUS

## 2017-01-21 MED ORDER — ASPIRIN 325 MG PO TABS
325.0000 mg | ORAL_TABLET | Freq: Every day | ORAL | Status: DC
  Administered 2017-01-22: 325 mg via ORAL
  Filled 2017-01-21: qty 1

## 2017-01-21 MED ORDER — HEPARIN (PORCINE) IN NACL 100-0.45 UNIT/ML-% IJ SOLN
INTRAMUSCULAR | Status: AC
  Filled 2017-01-21: qty 250

## 2017-01-21 MED ORDER — FLUTICASONE PROPIONATE 50 MCG/ACT NA SUSP
2.0000 | Freq: Every day | NASAL | Status: DC | PRN
  Filled 2017-01-21: qty 16

## 2017-01-21 MED ORDER — DEXAMETHASONE SODIUM PHOSPHATE 10 MG/ML IJ SOLN
INTRAMUSCULAR | Status: DC | PRN
  Administered 2017-01-21: 10 mg via INTRAVENOUS

## 2017-01-21 MED ORDER — LIDOCAINE HCL (PF) 1 % IJ SOLN
INTRAMUSCULAR | Status: AC
  Filled 2017-01-21: qty 30

## 2017-01-21 MED ORDER — PROPOFOL 10 MG/ML IV BOLUS
INTRAVENOUS | Status: DC | PRN
  Administered 2017-01-21: 200 mg via INTRAVENOUS

## 2017-01-21 MED ORDER — LIDOCAINE HCL (CARDIAC) 20 MG/ML IV SOLN
INTRAVENOUS | Status: DC | PRN
  Administered 2017-01-21: 100 mg via INTRAVENOUS

## 2017-01-21 MED ORDER — ALBUTEROL SULFATE (2.5 MG/3ML) 0.083% IN NEBU
3.0000 mL | INHALATION_SOLUTION | Freq: Four times a day (QID) | RESPIRATORY_TRACT | Status: DC | PRN
Start: 2017-01-21 — End: 2017-01-22

## 2017-01-21 MED ORDER — LISINOPRIL 20 MG PO TABS
20.0000 mg | ORAL_TABLET | Freq: Every day | ORAL | Status: DC
  Administered 2017-01-22: 20 mg via ORAL
  Filled 2017-01-21: qty 1

## 2017-01-21 MED ORDER — ASPIRIN 81 MG PO CHEW
CHEWABLE_TABLET | ORAL | Status: AC
  Administered 2017-01-21: 243 mg via ORAL
  Filled 2017-01-21: qty 3

## 2017-01-21 MED ORDER — IOPAMIDOL (ISOVUE-300) INJECTION 61%
INTRAVENOUS | Status: AC
  Administered 2017-01-21: 85 mL
  Filled 2017-01-21: qty 150

## 2017-01-21 MED ORDER — CLOPIDOGREL BISULFATE 75 MG PO TABS
75.0000 mg | ORAL_TABLET | Freq: Every day | ORAL | Status: DC
  Administered 2017-01-22: 75 mg via ORAL
  Filled 2017-01-21: qty 1

## 2017-01-21 MED ORDER — VANCOMYCIN HCL IN DEXTROSE 1-5 GM/200ML-% IV SOLN
INTRAVENOUS | Status: AC
  Filled 2017-01-21: qty 200

## 2017-01-21 MED ORDER — ACETAMINOPHEN 325 MG PO TABS
650.0000 mg | ORAL_TABLET | ORAL | Status: DC | PRN
  Administered 2017-01-21: 650 mg via ORAL
  Filled 2017-01-21: qty 2

## 2017-01-21 MED ORDER — SODIUM CHLORIDE 0.9 % IV SOLN
INTRAVENOUS | Status: DC
  Administered 2017-01-21: 22:00:00 via INTRAVENOUS

## 2017-01-21 MED ORDER — EPTIFIBATIDE 20 MG/10ML IV SOLN
INTRAVENOUS | Status: DC | PRN
  Administered 2017-01-21: 1.6 mg via INTRAVENOUS
  Administered 2017-01-21 (×2): 1.8 mg via INTRAVENOUS

## 2017-01-21 MED ORDER — HEPARIN (PORCINE) IN NACL 100-0.45 UNIT/ML-% IJ SOLN
850.0000 [IU]/h | INTRAMUSCULAR | Status: DC
  Administered 2017-01-21: 850 [IU]/h via INTRAVENOUS
  Filled 2017-01-21: qty 250

## 2017-01-21 MED ORDER — PROMETHAZINE HCL 25 MG/ML IJ SOLN: 6.2500 mg | INTRAMUSCULAR | Status: DC | PRN

## 2017-01-21 MED ORDER — BUDESONIDE 0.25 MG/2ML IN SUSP
0.2500 mg | Freq: Two times a day (BID) | RESPIRATORY_TRACT | Status: DC
  Filled 2017-01-21: qty 2

## 2017-01-21 MED ORDER — ASPIRIN 81 MG PO CHEW
243.0000 mg | CHEWABLE_TABLET | Freq: Once | ORAL | Status: AC
  Administered 2017-01-21: 243 mg via ORAL

## 2017-01-21 MED ORDER — VANCOMYCIN HCL 1000 MG IV SOLR
1000.0000 mg | INTRAVENOUS | Status: AC
  Administered 2017-01-21: 1000 mg via INTRAVENOUS

## 2017-01-21 MED ORDER — SUGAMMADEX SODIUM 200 MG/2ML IV SOLN
INTRAVENOUS | Status: DC | PRN
  Administered 2017-01-21: 200 mg via INTRAVENOUS

## 2017-01-21 MED ORDER — IOPAMIDOL (ISOVUE-300) INJECTION 61%
INTRAVENOUS | Status: AC
  Filled 2017-01-21: qty 150

## 2017-01-21 MED ORDER — ACETAMINOPHEN 650 MG RE SUPP: 650.0000 mg | RECTAL | Status: DC | PRN

## 2017-01-21 MED ORDER — FENTANYL CITRATE (PF) 100 MCG/2ML IJ SOLN: 25.0000 ug | INTRAMUSCULAR | Status: DC | PRN

## 2017-01-21 MED ORDER — NIMODIPINE 30 MG PO CAPS
0.0000 mg | ORAL_CAPSULE | ORAL | Status: AC
  Administered 2017-01-21: 30 mg via ORAL
  Filled 2017-01-21: qty 1

## 2017-01-21 MED ORDER — ONDANSETRON HCL 4 MG/2ML IJ SOLN: 4.0000 mg | Freq: Four times a day (QID) | INTRAMUSCULAR | Status: DC | PRN

## 2017-01-21 MED ORDER — LISINOPRIL 20 MG PO TABS: 20.0000 mg | ORAL_TABLET | Freq: Every day | ORAL | Status: DC

## 2017-01-21 MED ORDER — HEPARIN SODIUM (PORCINE) 1000 UNIT/ML IJ SOLN
INTRAMUSCULAR | Status: DC | PRN
  Administered 2017-01-21: 3000 [IU] via INTRAVENOUS
  Administered 2017-01-21: 1000 [IU] via INTRAVENOUS
  Administered 2017-01-21: 500 [IU] via INTRAVENOUS

## 2017-01-21 MED ORDER — ATORVASTATIN CALCIUM 80 MG PO TABS
80.0000 mg | ORAL_TABLET | Freq: Every day | ORAL | Status: DC
  Administered 2017-01-22: 80 mg via ORAL
  Filled 2017-01-21: qty 1

## 2017-01-21 MED ORDER — FENTANYL CITRATE (PF) 100 MCG/2ML IJ SOLN
INTRAMUSCULAR | Status: DC | PRN
  Administered 2017-01-21: 25 ug via INTRAVENOUS
  Administered 2017-01-21: 75 ug via INTRAVENOUS

## 2017-01-21 MED ORDER — SODIUM CHLORIDE 0.9 % IJ SOLN
INTRAMUSCULAR | Status: DC | PRN
  Administered 2017-01-21 (×3): 25 ug via INTRA_ARTERIAL

## 2017-01-21 MED ORDER — CLOPIDOGREL BISULFATE 75 MG PO TABS: 75.0000 mg | ORAL_TABLET | ORAL | Status: DC

## 2017-01-21 MED ORDER — ROCURONIUM BROMIDE 100 MG/10ML IV SOLN
INTRAVENOUS | Status: DC | PRN
  Administered 2017-01-21: 10 mg via INTRAVENOUS
  Administered 2017-01-21: 50 mg via INTRAVENOUS

## 2017-01-21 MED ORDER — CLOPIDOGREL BISULFATE 75 MG PO TABS
ORAL_TABLET | ORAL | Status: AC
  Filled 2017-01-21: qty 1

## 2017-01-21 MED ORDER — LABETALOL HCL 5 MG/ML IV SOLN
INTRAVENOUS | Status: DC | PRN
  Administered 2017-01-21: 5 mg via INTRAVENOUS

## 2017-01-21 MED ORDER — ONDANSETRON HCL 4 MG/2ML IJ SOLN
INTRAMUSCULAR | Status: DC | PRN
  Administered 2017-01-21: 4 mg via INTRAVENOUS

## 2017-01-21 MED ORDER — PHENYLEPHRINE 40 MCG/ML (10ML) SYRINGE FOR IV PUSH (FOR BLOOD PRESSURE SUPPORT)
PREFILLED_SYRINGE | INTRAVENOUS | Status: DC | PRN
  Administered 2017-01-21: 80 ug via INTRAVENOUS

## 2017-01-21 MED ORDER — ACETAMINOPHEN 160 MG/5ML PO SOLN: 650.0000 mg | ORAL | Status: DC | PRN

## 2017-01-21 MED ORDER — NITROGLYCERIN 1 MG/10 ML FOR IR/CATH LAB
INTRA_ARTERIAL | Status: AC
  Filled 2017-01-21: qty 10

## 2017-01-21 MED ORDER — SODIUM CHLORIDE 0.9 % IV SOLN
INTRAVENOUS | Status: DC
  Administered 2017-01-21 (×2): via INTRAVENOUS

## 2017-01-21 MED ORDER — SUCCINYLCHOLINE CHLORIDE 200 MG/10ML IV SOSY
PREFILLED_SYRINGE | INTRAVENOUS | Status: DC | PRN
  Administered 2017-01-21: 140 mg via INTRAVENOUS

## 2017-01-21 MED ORDER — NICARDIPINE HCL IN NACL 20-0.86 MG/200ML-% IV SOLN: 0.0000 mg/h | INTRAVENOUS | Status: DC

## 2017-01-21 MED ORDER — EPTIFIBATIDE 20 MG/10ML IV SOLN
INTRAVENOUS | Status: AC
  Filled 2017-01-21: qty 10

## 2017-01-21 MED ORDER — ASPIRIN EC 325 MG PO TBEC: 325.0000 mg | DELAYED_RELEASE_TABLET | ORAL | Status: DC

## 2017-01-21 MED ORDER — MIDAZOLAM HCL 5 MG/5ML IJ SOLN
INTRAMUSCULAR | Status: DC | PRN
  Administered 2017-01-21: 1 mg via INTRAVENOUS

## 2017-01-21 NOTE — Anesthesia Procedure Notes (Signed)
Procedure Name: MAC Date/Time: 01/21/2017 8:54 AM Performed by: Candis Shine Pre-anesthesia Checklist: Patient identified, Emergency Drugs available, Suction available, Patient being monitored and Timeout performed Patient Re-evaluated:Patient Re-evaluated prior to induction Oxygen Delivery Method: Nasal cannula Dental Injury: Teeth and Oropharynx as per pre-operative assessment

## 2017-01-21 NOTE — Anesthesia Postprocedure Evaluation (Signed)
Anesthesia Post Note  Patient: [name]  Procedure(s) Performed: Procedure(s) (LRB): angioplasty with stenting (N/A)     Patient location during evaluation: PACU Anesthesia Type: General Level of consciousness: awake and alert Pain management: pain level controlled Vital Signs Assessment: post-procedure vital signs reviewed and stable Respiratory status: spontaneous breathing, nonlabored ventilation, respiratory function stable and patient connected to nasal cannula oxygen Cardiovascular status: blood pressure returned to baseline and stable Postop Assessment: no apparent nausea or vomiting Anesthetic complications: no    Last Vitals:  Vitals:   01/21/17 1345 01/21/17 1400  BP: 115/70 118/69  Pulse: 79 77  Resp: 16 15  Temp:    SpO2: 98% 98%    Last Pain:  Vitals:   01/21/17 1400  TempSrc:   PainSc: [name]

## 2017-01-21 NOTE — H&P (Signed)
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 occlusion and potentially worsening left MCA distribution infarct with worsening neurological deficit. With this in in view, the patient will be started on aspirin 81 mg starting today, and add Plavix 75 mg a day 7 days prior to the procedure. Eliquis will be stopped 3 days prior to the procedure. The procedure will be done under general anesthesia. Questions were answered to their satisfaction. They were asked to call should they have any concerns or questions. In the meantime, the spouse and the patient were asked to continue with some form of physical therapy involving the right upper extremity whilst waiting for an appointment with rehab for PT/OT and speech therapy as an outpatient. The patient and the wife leave with good understanding and agreement with the above management plan. Electronically Signed   By: Julieanne Cotton M.D.   On: 01/12/2017 19:10   Ir Angio Intra Extracran Sel Com Carotid Innominate Bilat Mod Sed  Result Date: 12/29/2016 CLINICAL DATA:  Left cerebral hemispheric TIAs for 2 weeks, and new fixed speech abnormality and right upper extremity weakness. EXAM: BILATERAL COMMON CAROTID AND INNOMINATE  ANGIOGRAPHY; IR ANGIO VERTEBRAL SEL VERTEBRAL UNI RIGHT MOD SED; IR ANGIO VERTEBRAL SEL SUBCLAVIAN INNOMINATE UNI LEFT MOD SED COMPARISON:  CT angiogram the head and neck 12/26/2016. MEDICATIONS: Heparin 1500 units IV; no antibiotic was administered within 1 hour of the procedure. ANESTHESIA/SEDATION: Versed 1 mg IV; Fentanyl 75 mcg IV. Hydralazine 10 mg IV for high blood pressure. Moderate Sedation Time:  10 minutes. The patient was continuously monitored during the procedure by the interventional radiology nurse under my direct supervision. CONTRAST:  Isovue 300 approximately 60 mL. FLUOROSCOPY TIME:  Fluoroscopy Time: 10 minutes 6 seconds (550 mGy). COMPLICATIONS: None immediate. TECHNIQUE: Informed written consent was obtained from the patient after a thorough discussion of the procedural risks, benefits and alternatives. All questions were addressed. Maximal Sterile Barrier Technique was utilized including caps, mask, sterile gowns, sterile gloves, sterile drape, hand hygiene and skin antiseptic. A timeout was performed prior to the initiation of the procedure. The right groin was prepped and draped in the usual sterile fashion. Thereafter using modified Seldinger technique, transfemoral access into the right common femoral artery was obtained without difficulty. Over a 0.035 inch guidewire, a 5 French Pinnacle sheath was inserted. Through this, and  also over 0.035 inch guidewire, a 5 [place] JB 1 catheter was advanced to the aortic arch region and selectively positioned in the right common carotid artery,, the right vertebral artery, the left common carotid artery and the left vertebral artery. FINDINGS: The right vertebral origin is widely patent. The vessel is seen to opacify normally to the cranial skull base. Normal opacification is seen of the right posterior-inferior cerebellar artery and the right vertebrobasilar junction. There are mild focal areas of caliber irregularity of the mid 1/3 of the right  posterior-inferior cerebellar artery. Distally the basilar artery, the posterior cerebral arteries, the superior cerebellar arteries and the anterior-inferior cerebellar arteries opacify normally into the capillary and venous phases. Non-opacified blood is seen in the basilar artery from the contralateral vertebral artery. The right posterior-inferior cerebellar artery supplies the contralateral PICA distribution as well. The right common carotid arteriogram demonstrates the right external carotid artery and its major branches to be widely patent. The right internal carotid artery at the bulb to the cranial skull base opacifies normally. The petrous cavernous junction is widely patent. There is mild eccentric prominence of the junction of the caval cavernous in the proximal cavernous segment of the right internal carotid artery. Distally the right internal carotid supraclinoid segment is widely patent. The right middle cerebral artery M1 segment demonstrates mild prominence in its distal 1/3. There is a suggestion of a mild stenosis of the right middle cerebral artery at the bifurcation junction. More distally, the MCA distribution and the right anterior cerebral artery opacify normally into the capillary and venous phases. The left vertebral artery origin demonstrates approximately 50% stenosis at its origin. More distally, the vessel is seen to opacify normally to the cranial skull base. No discrete left posterior-inferior cerebellar artery is identified on this injection. The basilar artery, the posterior cerebral arteries, the superior cerebellar arteries and the anterior-inferior cerebellar arteries opacify normally into the capillary and venous phases. The left common carotid arteriogram demonstrates the left external carotid artery and its major branches to be widely patent. The left internal carotid artery at the bulb to the cranial skull base opacifies normally. The petrous, the cavernous segment and the  supraclinoid segments are widely patent. The left anterior cerebral artery opacifies into the capillary and venous phases. There is transient cross-filling via the anterior communicating artery of the right anterior cerebral artery. The left middle cerebral artery in its distal half demonstrates diffuse tapered narrowing which terminates proximal to the left MCA trifurcation. The magnified obliques sequences demonstrate the stenosis to be pre occlusive 95% plus with flow demonstrated into the superior and inferior divisions of the left middle cerebral artery. 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 Slight hemodynamic slow flow noted distal to the severe stenosis. Approximately 50% stenosis of the left vertebral artery at its origin. Dominant right posterior-inferior cerebellar artery supplying both the right and left PICA distributions. 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 Mild stenosis of the right middle cerebral artery proximal to the bifurcation suspicious of intracranial arteriosclerosis. PLAN: Findings were reviewed with the patient and the patient's wife. Endovascular revascularization of the symptomatic pre occlusive left M1 stenosis was an option discussed with the patient and the wife to prevent complete occlusion with possible devastating neurologic deficits. Electronically Signed   By: Julieanne Cotton M.D.   On: 12/28/2016 17:11   Ir Angio Vertebral Sel Vertebral Bilat Mod Sed  Result Date: 12/29/2016 CLINICAL DATA:  Left cerebral hemispheric TIAs for 2 weeks, and new fixed speech abnormality and right upper extremity weakness. EXAM: BILATERAL COMMON CAROTID AND INNOMINATE ANGIOGRAPHY; IR ANGIO VERTEBRAL SEL VERTEBRAL UNI RIGHT MOD SED; IR ANGIO VERTEBRAL SEL SUBCLAVIAN INNOMINATE UNI LEFT MOD SED COMPARISON:  CT angiogram the head and neck 12/26/2016. MEDICATIONS: Heparin 1500  units IV; no antibiotic was administered within 1 hour of the procedure. ANESTHESIA/SEDATION: Versed 1 mg IV; Fentanyl 75 mcg IV. Hydralazine 10 mg IV for high blood pressure. Moderate Sedation Time:  10 minutes. The patient was continuously monitored during the procedure by the interventional radiology nurse under my direct supervision. CONTRAST:  Isovue 300 approximately 60 mL. FLUOROSCOPY TIME:  Fluoroscopy Time: 10 minutes 6 seconds (550 mGy). COMPLICATIONS: None immediate. TECHNIQUE: Informed written consent was obtained from the patient after a thorough discussion of the procedural risks, benefits and alternatives. All questions were addressed. Maximal Sterile Barrier Technique was utilized including caps, mask, sterile gowns, sterile gloves, sterile drape, hand hygiene and skin antiseptic. A timeout was performed prior to the initiation of the procedure. The right groin was prepped and draped in the usual sterile fashion. Thereafter using modified Seldinger technique, transfemoral access into the right common femoral artery was obtained without difficulty. Over a 0.035 inch guidewire, a 5 French Pinnacle sheath was inserted. Through this, and also over 0.035 inch guidewire, a 5 [place] JB 1 catheter was advanced to the aortic arch region and selectively positioned in the right common carotid artery,, the right vertebral artery, the left common carotid artery and the left vertebral artery. FINDINGS: The right vertebral origin is widely patent. The vessel is seen to opacify normally to the cranial skull base. Normal opacification is seen of the right posterior-inferior cerebellar artery and the right vertebrobasilar junction. There are mild focal areas of caliber irregularity of the mid 1/3 of the right posterior-inferior cerebellar artery. Distally the basilar artery, the posterior cerebral arteries, the superior cerebellar arteries and the anterior-inferior cerebellar arteries opacify normally into the  capillary and venous phases. Non-opacified blood is seen in the basilar artery from the contralateral vertebral artery. The right posterior-inferior cerebellar artery supplies the contralateral PICA distribution as well. The right common carotid arteriogram demonstrates the right external carotid artery and its major branches to be widely patent. The right internal carotid artery at the bulb to the cranial skull base opacifies normally. The petrous cavernous junction is widely patent. There is mild eccentric prominence of the junction of the caval cavernous in the proximal cavernous segment of the right internal carotid artery. Distally the right internal carotid supraclinoid segment is widely patent. The right middle cerebral artery M1 segment demonstrates mild prominence in its distal 1/3. There is a suggestion of a mild stenosis of the right middle cerebral artery at the bifurcation junction. More distally, the MCA distribution and the right anterior cerebral artery opacify normally into the capillary and venous phases. The left vertebral artery origin demonstrates approximately 50% stenosis at its origin. More distally, the vessel is seen to opacify normally to the cranial skull base. No discrete left posterior-inferior cerebellar artery is identified on this injection. The basilar artery, the posterior cerebral arteries, the superior cerebellar arteries and the anterior-inferior cerebellar arteries opacify normally into the capillary and venous phases. The left common carotid arteriogram demonstrates the left external carotid artery and its major branches to be widely patent. The left internal carotid artery at the bulb to the cranial skull base opacifies normally. The petrous, the cavernous segment and the  supraclinoid segments are widely patent. The left anterior cerebral artery opacifies into the capillary and venous phases. There is transient cross-filling via the anterior communicating artery of the right  anterior cerebral artery. The left middle cerebral artery in its distal half demonstrates diffuse tapered narrowing which terminates proximal to the left MCA trifurcation. The magnified obliques sequences demonstrate the stenosis to be pre occlusive 95% plus with flow demonstrated into the superior and inferior divisions of the left middle cerebral artery. 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 Slight hemodynamic slow flow noted distal to the severe stenosis. Approximately 50% stenosis of the left vertebral artery at its origin. Dominant right posterior-inferior cerebellar artery supplying both the right and left PICA distributions. 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 Mild stenosis of the right middle cerebral artery proximal to the bifurcation suspicious of intracranial arteriosclerosis. PLAN: Findings were reviewed with the patient and the patient's wife. Endovascular revascularization of the symptomatic pre occlusive left M1 stenosis was an option discussed with the patient and the wife to prevent complete occlusion with possible devastating neurologic deficits. Electronically Signed   By: Julieanne Cotton M.D.   On: 12/28/2016 17:11    Labs:  CBC:  Recent Labs  12/29/16 0154 12/30/16 0754 12/31/16 1334 01/21/17 0649  WBC 10.5 7.8 7.7 7.5  HGB 12.8* 14.1 13.8 12.2*  HCT 39.2 44.1 43.0 37.6*  PLT 260 254 276.0 309    COAGS:  Recent Labs  12/26/16 1947 01/19/17 1126 01/21/17 0649  INR 0.98 0.92 0.98  APTT 29  --  29    BMP:  Recent Labs  12/29/16 0154 12/30/16 0754 12/31/16 1334 01/19/17 1126 01/21/17 0649  NA 137 139 138 138 138  K 3.8 3.9 3.6 4.3 3.7  CL 104 104 101 106 105  CO2 24 25 29  21* 23  GLUCOSE 115* 91 93 99 100*  BUN 16 10 14 12 16   CALCIUM 8.9 9.4 9.9 9.2 9.1  CREATININE 1.22 1.17 1.09 1.14 1.15  GFRNONAA >60 >60  --  >60 >60  GFRAA  >60 >60  --  >60 >60    LIVER FUNCTION TESTS:  Recent Labs  12/26/16 1947 12/27/16 0200 12/31/16 1334 01/21/17 0649  BILITOT 0.5 0.7 0.7 0.1*  AST 24 21 26 22   ALT 27 24 30  33  ALKPHOS 85 76 90 92  PROT 7.3 6.3* 8.1 6.8  ALBUMIN 4.1 3.5 4.6 3.6    TUMOR MARKERS: No results for input(s): AFPTM, CEA, CA199, CHROMGRNA in the last 8760 hours.  Assessment and Plan:  Stroke with mild residual right hand weakness and mild aphasia which is improving.  Left MCA stenosis.  Will proceed with cerebral angiography and angioplasty today by Dr. Corliss Skains.  Risks and benefits of cerebral angiography and angioplasty were discussed with the patient including, but not limited to bleeding, infection, vascular injury, contrast induced renal failure, stroke or even death.  This interventional procedure involves the use of X-rays and because of the nature of the planned procedure, it is possible that we will have prolonged use of X-ray fluoroscopy.  Potential radiation risks to you include (but are not limited to) the following: - A slightly elevated risk for cancer  several years later in life. This risk is typically less than 0.5% percent. This risk is low in comparison to the normal incidence of human cancer, which is 33% for women and 50% for men according to the [employer]  [employer]. - Radiation induced injury can include skin redness, resembling a rash, tissue breakdown / ulcers and hair loss (which can be temporary or permanent).   The likelihood of either of these occurring depends on the difficulty of the procedure and whether you are sensitive to radiation due to previous procedures, disease, or genetic conditions.   IF your procedure requires a prolonged use of radiation, you will be notified and given written instructions for further action.  It is your responsibility to monitor the irradiated area for the 2 weeks following the procedure and to notify your physician if you are  concerned that you have suffered a radiation induced injury.    All of the patient's questions were answered, patient is agreeable to proceed.  Consent signed and in chart.  Electronically Signed: Gwynneth Macleod, PA-C 01/21/2017, 7:59 AM   I spent a total of    25 Minutes in face to face in clinical consultation, greater than 50% of which was counseling/coordinating care for cerebral angio/stent.

## 2017-01-21 NOTE — Transfer of Care (Signed)
Immediate Anesthesia Transfer of Care Note  Patient: [name]  Procedure(s) Performed: Procedure(s): angioplasty with stenting (N/A)  Patient Location: PACU  Anesthesia Type:General  Level of Consciousness: awake, alert  and oriented  Airway & Oxygen Therapy: Patient Spontanous Breathing and Patient connected to nasal cannula oxygen  Post-op Assessment: Report given to RN and Post -op Vital signs reviewed and stable  Post vital signs: Reviewed and stable  Last Vitals:  Vitals:   01/21/17 0638  BP: 121/75  Pulse: 71  Resp: 20  Temp: 36.8 C  SpO2: 99%    Last Pain:  Vitals:   01/21/17 0638  TempSrc: Oral         Complications: No apparent anesthesia complications

## 2017-01-21 NOTE — Progress Notes (Signed)
Patient ID: [name], male   DOB: 04/06/1976, 41 y.o.   MRN: [id number] INR. Patient extubated . Awake .Sensorium gradually improving.. Denies H/As,N/V or visual changes. C/O sore throat. O/E  VS BP 110 /60.HR 70 to 80SR PaO2 > 95%  nasal cannula. Awake.Oriented to place and space. Pupils 3mm RT =LT  No obvious facial asymmetry. Tongue  In midline. RT hand grip 3/5 . Proximally 5-/5 unchanged. Mild pronation drift. RT LE 5/5 prox and distally. RT groin soft.No hematoma. Pulses DPs 2+PTs 1+ bilaterally. S.Rohini Jaroszewski MD

## 2017-01-21 NOTE — Anesthesia Procedure Notes (Addendum)
Arterial Line Insertion Start/End9/20/2018 10:00 AM, 01/21/2017 10:10 AM Performed by: Marcene Duos, anesthesiologist  Patient location: Pre-op. Preanesthetic checklist: patient identified, IV checked, site marked, risks and benefits discussed, surgical consent, monitors and equipment checked, pre-op evaluation, timeout performed and anesthesia consent Lidocaine 1% used for infiltration Left, radial was placed Catheter size: 20 Fr Hand hygiene performed  and maximum sterile barriers used   Attempts: 1 Procedure performed without using ultrasound guided technique. Ultrasound Notes:anatomy identified, needle tip was noted to be adjacent to the nerve/plexus identified and no ultrasound evidence of intravascular and/or intraneural injection Following insertion, dressing applied. Post procedure assessment: normal and unchanged  Patient tolerated the procedure well with no immediate complications.

## 2017-01-21 NOTE — Anesthesia Procedure Notes (Signed)
Procedure Name: Intubation Date/Time: 01/21/2017 9:53 AM Performed by: Candis Shine Pre-anesthesia Checklist: Patient identified, Emergency Drugs available, Suction available and Patient being monitored Patient Re-evaluated:Patient Re-evaluated prior to induction Oxygen Delivery Method: Circle System Utilized Preoxygenation: Pre-oxygenation with 100% oxygen Induction Type: IV induction Ventilation: Mask ventilation without difficulty and Oral airway inserted - appropriate to patient size Laryngoscope Size: Mac and 4 Grade View: Grade II Tube type: Oral Tube size: 7.5 mm Number of attempts: 1 Airway Equipment and Method: Stylet and Oral airway Placement Confirmation: ETT inserted through vocal cords under direct vision,  positive ETCO2 and breath sounds checked- equal and bilateral Secured at: 22 cm Tube secured with: Tape Dental Injury: Teeth and Oropharynx as per pre-operative assessment

## 2017-01-21 NOTE — Procedures (Signed)
S/P Lt MCA angioplasty  for severe symptomatic stenosis  . RT CFA approach.

## 2017-01-21 NOTE — H&P (Deleted)
  The note originally documented on this encounter has been moved the the encounter in which it belongs.  

## 2017-01-21 NOTE — Progress Notes (Addendum)
ANTICOAGULATION CONSULT NOTE - Follow Up Consult  Pharmacy Consult for heparin Indication: post-interventional neuroradiology procedure  Labs:  Recent Labs  01/19/17 1126 01/21/17 0649 01/21/17 2238  HGB  --  12.2*  --   HCT  --  37.6*  --   PLT  --  309  --   APTT  --  29  --   LABPROT 12.3 12.9  --   INR 0.92 0.98  --   HEPARINUNFRC  --   --  0.23*  CREATININE 1.14 1.15  --     Assessment/Plan:  41yo male therapeutic on heparin after rate change. Will continue gtt at current rate until off at 0700.   Vernard Gambles, PharmD, BCPS  01/21/2017,11:50 PM

## 2017-01-21 NOTE — Progress Notes (Signed)
Patient ID: [name], male   DOB: [birth year], 41 y.o.   MRN: [id number] Patient is s/p left MCA angioplasty for stenosis.  He is doing well this afternoon and hungry.  He denies any pain.    PE: Neuro: PERRL, facial symmetry present, grip strength is 3/5 in RUE, LUE unchanged.  Minimal pronation drift.  5/5 BLE strength.  +2DP and PTs. Skin: R CFA site is c/d/i with no bleeding.  Nontender.    A/P: S/p L MCA angioplasty Advance diet Cont heparin Cont ASA and Plavix cleviplex prn If doing well, will plan for DC tomorrow am  Derrian Poli E 4:20 PM 01/21/2017

## 2017-01-21 NOTE — Progress Notes (Signed)
ANTICOAGULATION CONSULT NOTE  Pharmacy Consult for heparin Indication: post-interventional neuroradiology procedure  Heparin Dosing Weight: 95.7 kg   Assessment: 41 yom s/p interventional neuroradiological procedure. Pharmacy consulted to dose heparin - currently running post-op at 500 units/hr. Hg 12.2, plt wnl. RN to call if bleed or IV line issues reported.  Goal of Therapy:  Heparin level 0.1-0.25 units/ml Monitor platelets by anticoagulation protocol: Yes   Plan:  Increase heparin to 850 units/hr 6h heparin level Monitor CBC, s/sx bleeding Heparin off at 0700 per protocol  Babs Bertin, PharmD, BCPS Clinical Pharmacist 01/21/2017 12:51 PM

## 2017-01-22 ENCOUNTER — Encounter (HOSPITAL_COMMUNITY): Payer: Self-pay | Admitting: Interventional Radiology

## 2017-01-22 DIAGNOSIS — I6602 Occlusion and stenosis of left middle cerebral artery: Secondary | ICD-10-CM | POA: Diagnosis not present

## 2017-01-22 LAB — CBC WITH DIFFERENTIAL/PLATELET
Basophils Absolute: 0 10*3/uL (ref 0.0–0.1)
Basophils Relative: 0 %
EOS ABS: 0 10*3/uL (ref 0.0–0.7)
EOS PCT: 0 %
HCT: 33.4 % — ABNORMAL LOW (ref 39.0–52.0)
Hemoglobin: 10.8 g/dL — ABNORMAL LOW (ref 13.0–17.0)
LYMPHS ABS: 1.9 10*3/uL (ref 0.7–4.0)
LYMPHS PCT: 15 %
MCH: 26.3 pg (ref 26.0–34.0)
MCHC: 32.3 g/dL (ref 30.0–36.0)
MCV: 81.3 fL (ref 78.0–100.0)
MONOS PCT: 7 %
Monocytes Absolute: 0.9 10*3/uL (ref 0.1–1.0)
Neutro Abs: 9.5 10*3/uL — ABNORMAL HIGH (ref 1.7–7.7)
Neutrophils Relative %: 78 %
PLATELETS: 309 10*3/uL (ref 150–400)
RBC: 4.11 MIL/uL — AB (ref 4.22–5.81)
RDW: 13.2 % (ref 11.5–15.5)
WBC: 12.2 10*3/uL — AB (ref 4.0–10.5)

## 2017-01-22 LAB — BASIC METABOLIC PANEL
Anion gap: 6 (ref 5–15)
BUN: 15 mg/dL (ref 6–20)
CHLORIDE: 111 mmol/L (ref 101–111)
CO2: 22 mmol/L (ref 22–32)
CREATININE: 1.06 mg/dL (ref 0.61–1.24)
Calcium: 8.3 mg/dL — ABNORMAL LOW (ref 8.9–10.3)
GFR calc Af Amer: >60 mL/min (ref >60)
GFR calc non Af Amer: >60 mL/min (ref >60)
GLUCOSE: 111 mg/dL — AB (ref 65–99)
POTASSIUM: 3.8 mmol/L (ref 3.5–5.1)
Sodium: 139 mmol/L (ref 135–145)

## 2017-01-22 LAB — PLATELET INHIBITION P2Y12: Platelet Function  P2Y12: 89 [PRU] — ABNORMAL LOW (ref 194–418)

## 2017-01-22 NOTE — Discharge Summary (Signed)
Patient ID: [name] MRN: [id number] DOB/AGE: [birth year] 41 y.o.  Admit date: 01/21/2017 Discharge date: 01/22/2017  Supervising Physician: Dr Julieanne Cotton  Patient Status: [facility] - In-pt  Admission Diagnoses: Left middle cerebral artery stenosis  Discharge Diagnoses:  Active Problems:   Middle cerebral artery stenosis, left   Discharged Condition: improved  Hospital Course: pt recent CVA 8/25; speech changes; right side weakness. Found to have Left middle cerebral artery stenosis per Angiogram. L MCA angioplasty in IR with Dr Corliss Skains 01/21/17. Procedure went well; no complications Admitted overnight without event. Eating and drinking well; using right arm/hand much better already. Right sided strength is better too. Denies headache; denies N/V Denies dizziness; or visual changes. Speech much improved. Dr Corliss Skains has seen and examined pt. Now for discharge to home  Consults: None  Significant Diagnostic Studies: CEREBRAL ANGIOGRAM  Treatments: S/P Lt MCA angioplasty  for severe symptomatic stenosis  . RT CFA approach.  Discharge Exam: Blood pressure (!) 152/92, pulse 77, temperature 98.3 F (36.8 C), temperature source Oral, resp. rate (!) 23, height  (1.803 m), weight 219 lb (99.3 kg), SpO2 99 %.  In NAD Pleasant Rested Face symmetrical Smile = Puffs cheeks = Speech improving; slight hesitation in speech---much improves since yesterday Heart: RRR Lungs: CTA Abd: soft; +BS NT  Right groin: NT no bleeding No hematoma Rt foot 2+ pulses Extr: FROM Good strength and sensation= Moves all 4s Improved grip on right; strong on left Right arm and hand able to straighten and with better control UOP: great; clear yellow DC foley and art line now  Results for orders placed or performed during the hospital encounter of 01/21/17  MRSA PCR Screening  Result Value Ref Range   MRSA by PCR NEGATIVE NEGATIVE  APTT  Result Value Ref Range   aPTT  29 24 - 36 seconds  CBC WITH DIFFERENTIAL  Result Value Ref Range   WBC 7.5 4.0 - 10.5 K/uL   RBC 4.58 4.22 - 5.81 MIL/uL   Hemoglobin 12.2 (L) 13.0 - 17.0 g/dL   HCT 30.8 (L) 65.7 - 84.6 %   MCV 82.1 78.0 - 100.0 fL   MCH 26.6 26.0 - 34.0 pg   MCHC 32.4 30.0 - 36.0 g/dL   RDW 96.2 95.2 - 84.1 %   Platelets 309 150 - 400 K/uL   Neutrophils Relative % 54 %   Neutro Abs 4.1 1.7 - 7.7 K/uL   Lymphocytes Relative 36 %   Lymphs Abs 2.7 0.7 - 4.0 K/uL   Monocytes Relative 7 %   Monocytes Absolute 0.5 0.1 - 1.0 K/uL   Eosinophils Relative 3 %   Eosinophils Absolute 0.2 0.0 - 0.7 K/uL   Basophils Relative 0 %   Basophils Absolute 0.0 0.0 - 0.1 K/uL  Comprehensive metabolic panel  Result Value Ref Range   Sodium 138 135 - 145 mmol/L   Potassium 3.7 3.5 - 5.1 mmol/L   Chloride 105 101 - 111 mmol/L   CO2 23 22 - 32 mmol/L   Glucose, Bld 100 (H) 65 - 99 mg/dL   BUN 16 6 - 20 mg/dL   Creatinine, Ser 3.24 0.61 - 1.24 mg/dL   Calcium 9.1 8.9 - 40.1 mg/dL   Total Protein 6.8 6.5 - 8.1 g/dL   Albumin 3.6 3.5 - 5.0 g/dL   AST 22 15 - 41 U/L   ALT 33 17 - 63 U/L   Alkaline Phosphatase 92 38 - 126 U/L   Total Bilirubin  0.1 (L) 0.3 - 1.2 mg/dL   GFR calc non Af Amer >60 >60 mL/min   GFR calc Af Amer >60 >60 mL/min   Anion gap 10 5 - 15  Platelet inhibition p2y12 (not at [facility])  Result Value Ref Range   Platelet Function  P2Y12 81 (L) 194 - 418 PRU  Protime-INR  Result Value Ref Range   Prothrombin Time 12.9 11.4 - 15.2 seconds   INR 0.98   Heparin level (unfractionated)  Result Value Ref Range   Heparin Unfractionated 0.23 (L) 0.30 - 0.70 IU/mL  Basic metabolic panel  Result Value Ref Range   Sodium 139 135 - 145 mmol/L   Potassium 3.8 3.5 - 5.1 mmol/L   Chloride 111 101 - 111 mmol/L   CO2 22 22 - 32 mmol/L   Glucose, Bld 111 (H) 65 - 99 mg/dL   BUN 15 6 - 20 mg/dL   Creatinine, Ser 1.61 0.61 - 1.24 mg/dL   Calcium 8.3 (L) 8.9 - 10.3 mg/dL   GFR calc non Af Amer >60 >60 mL/min     GFR calc Af Amer >60 >60 mL/min   Anion gap 6 5 - 15  CBC WITH DIFFERENTIAL  Result Value Ref Range   WBC 12.2 (H) 4.0 - 10.5 K/uL   RBC 4.11 (L) 4.22 - 5.81 MIL/uL   Hemoglobin 10.8 (L) 13.0 - 17.0 g/dL   HCT 09.6 (L) 04.5 - 40.9 %   MCV 81.3 78.0 - 100.0 fL   MCH 26.3 26.0 - 34.0 pg   MCHC 32.3 30.0 - 36.0 g/dL   RDW 81.1 91.4 - 78.2 %   Platelets 309 150 - 400 K/uL   Neutrophils Relative % 78 %   Neutro Abs 9.5 (H) 1.7 - 7.7 K/uL   Lymphocytes Relative 15 %   Lymphs Abs 1.9 0.7 - 4.0 K/uL   Monocytes Relative 7 %   Monocytes Absolute 0.9 0.1 - 1.0 K/uL   Eosinophils Relative 0 %   Eosinophils Absolute 0.0 0.0 - 0.7 K/uL   Basophils Relative 0 %   Basophils Absolute 0.0 0.0 - 0.1 K/uL     Disposition: Left middle cerebral artery stenosis Angioplasty in IR with Dr Corliss Skains 9/20 No complication Overnight stay was without event Plan for discharge to home Dr Corliss Skains has seen and examined pt He understand discharge instructions 2 week follow up with Dr Corliss Skains Continue all meds---ASA/Plavix Stop Eliquis for now----will re evaluate in 2 weeks Pt to see Dr Zipporah Plants (PMD) in next week for follow up of blood work   Discharge Instructions    Call MD for:  difficulty breathing, headache or visual disturbances    Complete by:  As directed    Call MD for:  extreme fatigue    Complete by:  As directed    Call MD for:  hives    Complete by:  As directed    Call MD for:  persistant dizziness or light-headedness    Complete by:  As directed    Call MD for:  persistant nausea and vomiting    Complete by:  As directed    Call MD for:  redness, tenderness, or signs of infection (pain, swelling, redness, odor or green/yellow discharge around incision site)    Complete by:  As directed    Call MD for:  severe uncontrolled pain    Complete by:  As directed    Call MD for:  temperature >100.4    Complete by:  As directed    Diet - low sodium heart healthy    Complete by:   As directed    Discharge instructions    Complete by:  As directed    Discontinue Eliquis for now; resume ASA 81 and Plavix 75 mg daily; resume all home meds; 2 week follow up with Dr Conception Oms will hear from scheduler   Discharge wound care:    Complete by:  As directed    May shower today; keep clean band aid on rt groin site daily x 1 week   Driving Restrictions    Complete by:  As directed    No driving x 2 weeks   Increase activity slowly    Complete by:  As directed    Lifting restrictions    Complete by:  As directed    No lifting over 10 lbs x 2 weeks     Allergies as of 01/22/2017      Reactions   Penicillins Other (See Comments)   Shut the kidney's down Has patient had a PCN reaction causing immediate rash, facial/tongue/throat swelling, SOB or lightheadedness with hypotension: No Has patient had a PCN reaction causing severe rash involving mucus membranes or skin necrosis: No Has patient had a PCN reaction that required hospitalization: yes Has patient had a PCN reaction occurring within the last 10 years: No If all of the above answers are "NO", then may proceed with Cephalosporin use.   Robaxin [methocarbamol] Other (See Comments)   Shuts kidneys down    Toradol [ketorolac Tromethamine] Other (See Comments)   Shuts kidneys down      Medication List    STOP taking these medications   apixaban 5 MG Tabs tablet Commonly known as:  ELIQUIS     TAKE these medications   Albuterol Sulfate 108 (90 Base) MCG/ACT Aepb Inhale 1-2 puffs into the lungs every 6 (six) hours as needed (SOB, cough, wheezing).   aspirin EC 81 MG tablet Take 81 mg by mouth daily.   atorvastatin 80 MG tablet Commonly known as:  LIPITOR Take 1 tablet (80 mg total) by mouth daily at 6 PM.   clopidogrel 75 MG tablet Commonly known as:  PLAVIX Take 75 mg by mouth daily.   fluticasone 110 MCG/ACT inhaler Commonly known as:  FLOVENT HFA Inhale 2 puffs into the lungs 2 (two) times  daily. What changed:  when to take this  reasons to take this   fluticasone 50 MCG/ACT nasal spray Commonly known as:  FLONASE Place 2 sprays into both nostrils daily. What changed:  when to take this  reasons to take this   lisinopril 20 MG tablet Commonly known as:  PRINIVIL,ZESTRIL Take 1 tablet (20 mg total) by mouth daily.            Discharge Care Instructions        Start     Ordered   01/22/17 0000  IR Radiologist Eval & Mgmt    Question Answer Comment  Reason for Exam (SYMPTOM  OR DIAGNOSIS REQUIRED) 2 week follow up with Dr Corliss Skains; pt will hear from scheduler for time and date   Preferred Imaging Location? [facility]      01/22/17 0859   01/22/17 0000  Increase activity slowly     01/22/17 0859   01/22/17 0000  Diet - low sodium heart healthy     01/22/17 0859   01/22/17 0000  Discharge instructions    Comments:  Discontinue Eliquis for now; resume  ASA 81 and Plavix 75 mg daily; resume all home meds; 2 week follow up with Dr Conception Oms will hear from scheduler   01/22/17 0859   01/22/17 0000  Driving Restrictions    Comments:  No driving x 2 weeks   40/98/11 0859   01/22/17 0000  Lifting restrictions    Comments:  No lifting over 10 lbs x 2 weeks   01/22/17 0859   01/22/17 0000  Discharge wound care:    Comments:  May shower today; keep clean band aid on rt groin site daily x 1 week   01/22/17 0859   01/22/17 0000  Call MD for:  temperature >100.4     01/22/17 0859   01/22/17 0000  Call MD for:  persistant nausea and vomiting     01/22/17 0859   01/22/17 0000  Call MD for:  severe uncontrolled pain     01/22/17 0859   01/22/17 0000  Call MD for:  redness, tenderness, or signs of infection (pain, swelling, redness, odor or green/yellow discharge around incision site)     01/22/17 0859   01/22/17 0000  Call MD for:  difficulty breathing, headache or visual disturbances     01/22/17 0859   01/22/17 0000  Call MD for:  persistant  dizziness or light-headedness     01/22/17 0859   01/22/17 0000  Call MD for:  hives     01/22/17 0859   01/22/17 0000  Call MD for:  extreme fatigue     01/22/17 0859     Follow-up Information    Julieanne Cotton, MD Follow up in 2 week(s).   Specialty:  Interventional Radiology Why:  pt will hear from scheduler for time and date of 2 week follow up with Dr Corliss Skains; call [phone] if questions or concerns Contact information: [address] [place] [address] [phone]            Electronically Signed: Ralene Muskrat A, PA-C 01/22/2017, 9:04 AM   I have spent Greater Than 30 Minutes discharging [name].

## 2017-01-25 ENCOUNTER — Telehealth: Payer: Self-pay | Admitting: Behavioral Health

## 2017-01-25 NOTE — Addendum Note (Signed)
Addendum  created 01/25/17 0944 by Marcene Duos, MD   Anesthesia Intra Blocks edited, Sign clinical note

## 2017-01-25 NOTE — Telephone Encounter (Signed)
Hospital Follow-up appointment scheduled for 01/27/17 at 11:15 AM.

## 2017-01-26 ENCOUNTER — Encounter (HOSPITAL_COMMUNITY): Payer: Self-pay | Admitting: Interventional Radiology

## 2017-01-26 NOTE — Addendum Note (Signed)
Addendum  created 01/26/17 1191 by Jed Limerick, CRNA   Charge Capture section accepted

## 2017-01-27 ENCOUNTER — Encounter: Payer: Self-pay | Admitting: Family Medicine

## 2017-01-27 ENCOUNTER — Ambulatory Visit (INDEPENDENT_AMBULATORY_CARE_PROVIDER_SITE_OTHER): Payer: BLUE CROSS/BLUE SHIELD | Admitting: Family Medicine

## 2017-01-27 VITALS — BP 122/86 | HR 67 | Temp 98.4°F | Ht 71.0 in | Wt 217.4 lb

## 2017-01-27 DIAGNOSIS — Z8673 Personal history of transient ischemic attack (TIA), and cerebral infarction without residual deficits: Secondary | ICD-10-CM

## 2017-01-27 DIAGNOSIS — R29898 Other symptoms and signs involving the musculoskeletal system: Secondary | ICD-10-CM | POA: Diagnosis not present

## 2017-01-27 DIAGNOSIS — R0681 Apnea, not elsewhere classified: Secondary | ICD-10-CM

## 2017-01-27 DIAGNOSIS — R0683 Snoring: Secondary | ICD-10-CM

## 2017-01-27 DIAGNOSIS — I63512 Cerebral infarction due to unspecified occlusion or stenosis of left middle cerebral artery: Secondary | ICD-10-CM

## 2017-01-27 NOTE — Patient Instructions (Signed)
Set up your voicemail.  If you do not hear anything about your referrals in the next week, call our office and ask for an update.  Give [place] 2-3 business days to get the results of your labs back. If labs are normal, you will likely receive a letter in the mail unless you have MyChart. This can take longer than 2-3 business days.   Let us know if you need anything.
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
## 2017-01-27 NOTE — Progress Notes (Signed)
Pre visit review using our clinic review tool, if applicable. No additional management support is needed unless otherwise documented below in the visit note. 

## 2017-01-27 NOTE — Addendum Note (Signed)
Addended by: Verdie Shire on: 01/27/2017 05:59 PM   Modules accepted: Orders

## 2017-02-01 ENCOUNTER — Telehealth: Payer: Self-pay | Admitting: Family Medicine

## 2017-02-01 DIAGNOSIS — I10 Essential (primary) hypertension: Secondary | ICD-10-CM

## 2017-02-01 MED ORDER — LISINOPRIL 20 MG PO TABS: 20.0000 mg | ORAL_TABLET | Freq: Every day | ORAL | 2 refills | Status: DC

## 2017-02-01 MED ORDER — CLOPIDOGREL BISULFATE 75 MG PO TABS: 75.0000 mg | ORAL_TABLET | Freq: Every day | ORAL | 2 refills | Status: DC

## 2017-02-01 MED ORDER — ATORVASTATIN CALCIUM 80 MG PO TABS: 80.0000 mg | ORAL_TABLET | Freq: Every day | ORAL | 2 refills | Status: DC

## 2017-02-01 NOTE — Telephone Encounter (Signed)
Called the patient no voice mail set up/no answer

## 2017-02-01 NOTE — Telephone Encounter (Signed)
Pharmacy filled Plavix from Dr. Titus Dubin on 01/12/2017  #30 with 2 refills And on 01/07/2017 eliquis 5 mg bid #60  was filled by dr. Marland Mcalpine. Patient currently has both and is taking

## 2017-02-01 NOTE — Telephone Encounter (Signed)
Patient informed of PCP instructions on medication. 

## 2017-02-01 NOTE — Telephone Encounter (Signed)
Eliquis was D/C'd by IR and Plavix was started. He was on Eliquis for a DVT. F/u appt with IR on 10/8, will hold Eliquis for now until he follows up with Dr. Fatima Sanger team.

## 2017-02-01 NOTE — Telephone Encounter (Signed)
Sent in his refills He did state that is he on Eliquis 5 mg bid---this is not on his current list, but historical.  Not sure why it is off if he is to take it.  But he needs a refill if he is to be on it--let me know.

## 2017-02-01 NOTE — Telephone Encounter (Signed)
Self.    Refill for Lipitor 80 MG AND lisinopril 20 MG - pt would like to have a 3 month supply.      Pharmacy:  [id number] - [place], [place] - [address]   --pt says that he is completely out

## 2017-02-08 ENCOUNTER — Other Ambulatory Visit (HOSPITAL_COMMUNITY): Payer: Self-pay | Admitting: Radiology

## 2017-02-08 ENCOUNTER — Ambulatory Visit (HOSPITAL_COMMUNITY)
Admission: RE | Admit: 2017-02-08 | Discharge: 2017-02-08 | Disposition: A | Discharge status: Home / Self Care | Payer: BLUE CROSS/BLUE SHIELD | Source: Ambulatory Visit | Attending: Radiology | Admitting: Radiology

## 2017-02-08 DIAGNOSIS — Z7902 Long term (current) use of antithrombotics/antiplatelets: Secondary | ICD-10-CM | POA: Diagnosis not present

## 2017-02-08 DIAGNOSIS — I6320 Cerebral infarction due to unspecified occlusion or stenosis of unspecified precerebral arteries: Secondary | ICD-10-CM

## 2017-02-08 DIAGNOSIS — R531 Weakness: Secondary | ICD-10-CM | POA: Insufficient documentation

## 2017-02-08 DIAGNOSIS — Z880 Allergy status to penicillin: Secondary | ICD-10-CM | POA: Diagnosis not present

## 2017-02-08 DIAGNOSIS — Z7951 Long term (current) use of inhaled steroids: Secondary | ICD-10-CM | POA: Insufficient documentation

## 2017-02-08 DIAGNOSIS — I6602 Occlusion and stenosis of left middle cerebral artery: Secondary | ICD-10-CM

## 2017-02-08 DIAGNOSIS — Z8679 Personal history of other diseases of the circulatory system: Secondary | ICD-10-CM | POA: Insufficient documentation

## 2017-02-08 DIAGNOSIS — Z7982 Long term (current) use of aspirin: Secondary | ICD-10-CM | POA: Diagnosis not present

## 2017-02-08 HISTORY — PX: IR RADIOLOGIST EVAL & MGMT: IMG5224

## 2017-02-08 LAB — PLATELET INHIBITION P2Y12: PLATELET FUNCTION P2Y12: 25 [PRU] — AB (ref 194–418)

## 2017-02-09 ENCOUNTER — Encounter (HOSPITAL_COMMUNITY): Payer: Self-pay | Admitting: Interventional Radiology

## 2017-02-17 ENCOUNTER — Telehealth (HOSPITAL_COMMUNITY): Payer: Self-pay | Admitting: *Deleted

## 2017-02-18 ENCOUNTER — Telehealth: Payer: Self-pay | Admitting: Family Medicine

## 2017-02-18 NOTE — Telephone Encounter (Signed)
Relation to ZO:XWRUpt:self Call back [phone]   Reason for call:  Patient inquiring or in need of clarification regarding why orders were placed for CT ANGIO HEAD W/CM &/OR WO CM, please advise

## 2017-02-18 NOTE — Telephone Encounter (Signed)
CTA follow up was recommended by the primary team when he was in the hospital from August. It was ordered the next day when he saw me. After everything else that has transpired, I am not so certain we need it anymore. Let's see if we can reach out to his IR's office to see if it is necessary. TY.

## 2017-02-19 NOTE — Telephone Encounter (Signed)
What is the IR office?

## 2017-02-19 NOTE — Telephone Encounter (Signed)
Interventional radiology- Dr. Corliss Skainseveshwar. TY.

## 2017-02-23 ENCOUNTER — Ambulatory Visit: Payer: BLUE CROSS/BLUE SHIELD | Attending: Family Medicine | Admitting: Occupational Therapy

## 2017-02-23 VITALS — BP 131/89 | HR 70

## 2017-02-23 DIAGNOSIS — R278 Other lack of coordination: Secondary | ICD-10-CM

## 2017-02-23 DIAGNOSIS — I69351 Hemiplegia and hemiparesis following cerebral infarction affecting right dominant side: Secondary | ICD-10-CM | POA: Insufficient documentation
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
## 2017-02-24 ENCOUNTER — Encounter (HOSPITAL_BASED_OUTPATIENT_CLINIC_OR_DEPARTMENT_OTHER): Payer: Self-pay

## 2017-02-24 ENCOUNTER — Ambulatory Visit (HOSPITAL_BASED_OUTPATIENT_CLINIC_OR_DEPARTMENT_OTHER)
Admission: RE | Admit: 2017-02-24 | Discharge: 2017-02-24 | Disposition: A | Discharge status: Home / Self Care | Payer: BLUE CROSS/BLUE SHIELD | Source: Ambulatory Visit | Attending: Family Medicine | Admitting: Family Medicine

## 2017-02-24 DIAGNOSIS — I639 Cerebral infarction, unspecified: Secondary | ICD-10-CM

## 2017-02-24 MED ORDER — IOPAMIDOL (ISOVUE-370) INJECTION 76%
100.0000 mL | Freq: Once | INTRAVENOUS | Status: AC | PRN
  Administered 2017-02-24: 80 mL via INTRAVENOUS

## 2017-02-24 MED ORDER — IOPAMIDOL (ISOVUE-300) INJECTION 61%: 100.0000 mL | Freq: Once | INTRAVENOUS | Status: DC | PRN

## 2017-03-02 ENCOUNTER — Encounter: Payer: Self-pay | Admitting: Neurology

## 2017-03-02 ENCOUNTER — Ambulatory Visit (INDEPENDENT_AMBULATORY_CARE_PROVIDER_SITE_OTHER): Payer: BLUE CROSS/BLUE SHIELD | Admitting: Neurology

## 2017-03-02 VITALS — BP 128/74 | HR 78 | Ht 71.0 in | Wt 221.4 lb

## 2017-03-02 DIAGNOSIS — I82431 Acute embolism and thrombosis of right popliteal vein: Secondary | ICD-10-CM

## 2017-03-02 DIAGNOSIS — E785 Hyperlipidemia, unspecified: Secondary | ICD-10-CM

## 2017-03-02 DIAGNOSIS — I10 Essential (primary) hypertension: Secondary | ICD-10-CM

## 2017-03-02 DIAGNOSIS — I6602 Occlusion and stenosis of left middle cerebral artery: Secondary | ICD-10-CM | POA: Diagnosis not present

## 2017-03-02 NOTE — Patient Instructions (Signed)
-   continue ASA and plavix for now. But I will discuss with Dr. Corliss Skainseveshwar and you may need to go back to eliquis - continue lipitor on stroke prevention - check BP at home and record - Follow up with your primary care physician for stroke risk factor modification. Recommend maintain blood pressure goal <130/80, diabetes with hemoglobin A1c goal below 7.0% and lipids with LDL cholesterol goal below 70 mg/dL.  - continue PT/OT for strength  - will refer to speech therapy too  - healthy diet and regular exercise - follow up in 3 months.
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
## 2017-03-04 ENCOUNTER — Encounter: Payer: Self-pay | Admitting: Family Medicine

## 2017-03-04 ENCOUNTER — Ambulatory Visit (INDEPENDENT_AMBULATORY_CARE_PROVIDER_SITE_OTHER): Payer: BLUE CROSS/BLUE SHIELD | Admitting: Family Medicine

## 2017-03-04 ENCOUNTER — Ambulatory Visit: Payer: BLUE CROSS/BLUE SHIELD | Attending: Family Medicine | Admitting: Occupational Therapy

## 2017-03-04 VITALS — BP 118/81 | HR 76

## 2017-03-04 VITALS — BP 114/78 | HR 76 | Temp 98.5°F | Ht 71.0 in | Wt 221.0 lb

## 2017-03-04 DIAGNOSIS — F8081 Childhood onset fluency disorder: Secondary | ICD-10-CM | POA: Diagnosis present

## 2017-03-04 DIAGNOSIS — I6930 Unspecified sequelae of cerebral infarction: Secondary | ICD-10-CM | POA: Diagnosis not present

## 2017-03-04 DIAGNOSIS — I10 Essential (primary) hypertension: Secondary | ICD-10-CM

## 2017-03-04 DIAGNOSIS — I69351 Hemiplegia and hemiparesis following cerebral infarction affecting right dominant side: Secondary | ICD-10-CM | POA: Diagnosis present

## 2017-03-04 DIAGNOSIS — R278 Other lack of coordination: Secondary | ICD-10-CM | POA: Diagnosis present

## 2017-03-04 NOTE — Patient Instructions (Addendum)
  Coordination Activities  Perform the following activities for 20 minutes 1-2 times per day with right hand(s).   Rotate ball in fingertips (clockwise and counter-clockwise).  Toss ball between hands.  Toss ball in air and catch with the same hand.  Flip cards 1 at a time as fast as you can.  Deal cards with your thumb (Hold deck in hand and push card off top with thumb).  Shuffle cards.  Pick up coins and stack.  Pick up coins one at a time until you get 5-10 in your hand, then move coins from palm to fingertips to place in container one at a time.  Practice writing and/or typing.   1. Grip Strengthening (Resistive Putty)   Squeeze putty using thumb and all fingers. Repeat 20 times. Do 1-2 sessions per day.   Extension (Assistive Putty)   Roll putty back and forth, being sure to use all fingertips. Repeat 3 times. Do 1-2 sessions per day.  Then pinch as below.   Palmar Pinch Strengthening (Resistive Putty)   Pinch putty between thumb and each fingertip in turn after rolling out    Finger and Thumb Extension (Resistive Putty)   With thumb and all fingers in center of putty donut, stretch out. Repeat 5-10 times. Do 1 sessions per day.   MP Flexion (Resistive Putty)   Bending only at large knuckles, press putty down against thumb. Keep fingertips straight. Repeat 10 times. Do 1-2 sessions per day.
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
## 2017-03-04 NOTE — Progress Notes (Signed)
Pre visit review using our clinic review tool, if applicable. No additional management support is needed unless otherwise documented below in the visit note. 
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
## 2017-03-04 NOTE — Patient Instructions (Addendum)
Let me know if there is any paperwork that needs to be filled out before Dr. Corliss Skainseveshwar gets back into the country.  Call Dr. Warren DanesXu's office regarding your balance issues and headaches. I don't have a great answer outside of the stroke effect. I would ask him to reconsider releasing you for work. If you wish to return to work sooner than what I wrote for you today, let me know.  Let us know if you need anything.

## 2017-03-11 ENCOUNTER — Encounter: Payer: Self-pay | Admitting: Occupational Therapy

## 2017-03-11 ENCOUNTER — Ambulatory Visit: Payer: BLUE CROSS/BLUE SHIELD | Admitting: Occupational Therapy

## 2017-03-11 VITALS — BP 128/84 | HR 78

## 2017-03-11 DIAGNOSIS — I69351 Hemiplegia and hemiparesis following cerebral infarction affecting right dominant side: Secondary | ICD-10-CM

## 2017-03-11 DIAGNOSIS — R278 Other lack of coordination: Secondary | ICD-10-CM
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
## 2017-03-16 ENCOUNTER — Encounter: Payer: Self-pay | Admitting: Occupational Therapy

## 2017-03-16 ENCOUNTER — Ambulatory Visit: Payer: BLUE CROSS/BLUE SHIELD | Admitting: Occupational Therapy

## 2017-03-16 DIAGNOSIS — R278 Other lack of coordination: Secondary | ICD-10-CM

## 2017-03-16 DIAGNOSIS — I69351 Hemiplegia and hemiparesis following cerebral infarction affecting right dominant side: Secondary | ICD-10-CM
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
## 2017-03-18 ENCOUNTER — Ambulatory Visit: Payer: BLUE CROSS/BLUE SHIELD

## 2017-03-18 ENCOUNTER — Encounter: Payer: Self-pay | Admitting: Occupational Therapy

## 2017-03-18 ENCOUNTER — Ambulatory Visit: Payer: BLUE CROSS/BLUE SHIELD | Admitting: Occupational Therapy

## 2017-03-18 ENCOUNTER — Other Ambulatory Visit: Payer: Self-pay

## 2017-03-18 DIAGNOSIS — I69351 Hemiplegia and hemiparesis following cerebral infarction affecting right dominant side: Secondary | ICD-10-CM | POA: Diagnosis not present

## 2017-03-18 DIAGNOSIS — R278 Other lack of coordination: Secondary | ICD-10-CM

## 2017-03-18 DIAGNOSIS — F8081 Childhood onset fluency disorder: Secondary | ICD-10-CM

## 2017-03-18 NOTE — Patient Instructions (Addendum)
Strengthening: Resisted Flexion   Attach tube to door.  Hold tubing with one arm at side. Pull forward and up. Move shoulder through pain-free range of motion. Repeat 20 times per set.  Do 1-2 sessions per day.    Strengthening: Resisted Extension   Attach one end to door.  Hold tubing in one hand, arm forward. Pull arm back, elbow straight. Repeat 20 times per set. Do 1-2 sessions per day.   Resisted Horizontal Abduction: Bilateral   Sit or stand, tubing in both hands, arms out in front. Keeping arms straight, pinch shoulder blades together and stretch arms out. Repeat 20 times per set.  Do 1-2 sessions per day.     Elbow Flexion: Resisted   Hold tubing wrapped around  One hand and and other end secured under foot, curl arm up as far as possible. Repeat 20 times per set.  Do 1-2 sessions per day.    Elbow Extension: Resisted   Hold band in left hand with elbow bent. Straighten  right elbow. Repeat 20 times per set.  Do 1-2 sessions per day.     Strengthening: Resisted Abduction    Place end of band under foot.  Hold tubing with right arm across body. Pull up and away from side. Thumb up. Move through pain-free range of motion. Repeat 20 times per set.  Do 1-2 sessions per day.

## 2017-03-18 NOTE — Patient Instructions (Addendum)
LIP and TONGUE exercises  --- DO ALL EXERCISES TWICE EACH DAY  1. Lip press - Press your lips together firmly (like making a /m/ sound) and hold 5 seconds -repeat 10 times  2. LOUD and LONG Pryor MontesKiss - make a kissing sound as loud as you can, as long as you can - repeat 10 times  3. Tongue sweep - Sweep your tongue in the pockets of your mouth - touch every tooth  - five revolutions each way  4. PUH! - 10 times       MAKE THE SOUNDS STRONG      TUH - 10 times      KUH! - 10 times  5. Move a licorice stick or lollipop from side to side in your mouth - 10 back and forth movements  6. Put air in your cheeks, and push on either side 10 times   - *KEEP THE AIR IN and DON'T LET IT ESCAPE!!*  7. Pucker your lips as tightly as you can, hold 5 seconds - repeat 10 times    =============================================================================================  Speech Exercises  Repeat each phrase 2 times, 2-3 times a day  Call the cat "Buttercup" A calendar of [place] Four floors to cover Yellow oil ointment Fellow lovers of felines Catastrophe in [place] Plump plumbers' plums The church's chimes chimed Telling time 'til eleven Five valve levers Keep the gate closed Go see that guy Fat cows give milk Automatic DataMinnesota Golden Gophers Fat frogs flip freely TXU Corpuck Tommy into bed Get that game to American Standard Companiesreg Thick thistles stick together Cinnamon aluminum linoleum Black bugs blood Lovely lemon linament Red leather, yellow leather  Big grocery buggy    Purple baby carriage [facility] Proper copper coffee pot Ripe purple cabbage Three free throws Owens-Illinoisim Tebow tackled  [employer] [employer] [place] Dave dipped the dessert  Duke Nav[employer] International CorporationBlue Devils Buckle that Health Netbracket    The gospel of SavannaMark Shirts shrink, shells shouldn't GardnerSan [place] 49ers Take the tackle box File the flash message Give me five flapjacks Fundamental relatives Dye the pets purple Talking  Malaw[place]urkey time after time Dark chocolate chunks Political landscape of the kingdom Actuarylectrical engineering genius We played yo-yos yesterday
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
## 2017-03-23 ENCOUNTER — Ambulatory Visit: Payer: BLUE CROSS/BLUE SHIELD | Admitting: Occupational Therapy

## 2017-03-23 ENCOUNTER — Ambulatory Visit: Payer: BLUE CROSS/BLUE SHIELD | Admitting: *Deleted

## 2017-03-30 ENCOUNTER — Ambulatory Visit: Payer: BLUE CROSS/BLUE SHIELD | Admitting: *Deleted

## 2017-03-30 ENCOUNTER — Ambulatory Visit: Payer: BLUE CROSS/BLUE SHIELD | Admitting: Occupational Therapy

## 2017-03-30 ENCOUNTER — Encounter: Payer: Self-pay | Admitting: Occupational Therapy

## 2017-03-30 DIAGNOSIS — F8081 Childhood onset fluency disorder: Secondary | ICD-10-CM

## 2017-03-30 DIAGNOSIS — R278 Other lack of coordination: Secondary | ICD-10-CM

## 2017-03-30 DIAGNOSIS — I69351 Hemiplegia and hemiparesis following cerebral infarction affecting right dominant side: Secondary | ICD-10-CM
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
## 2017-04-01 ENCOUNTER — Telehealth: Payer: Self-pay | Admitting: Occupational Therapy

## 2017-04-01 ENCOUNTER — Encounter: Payer: Self-pay | Admitting: Occupational Therapy

## 2017-04-01 ENCOUNTER — Ambulatory Visit: Payer: BLUE CROSS/BLUE SHIELD | Admitting: Occupational Therapy

## 2017-04-01 DIAGNOSIS — I69351 Hemiplegia and hemiparesis following cerebral infarction affecting right dominant side: Secondary | ICD-10-CM | POA: Diagnosis not present

## 2017-04-01 DIAGNOSIS — R531 Weakness: Secondary | ICD-10-CM

## 2017-04-01 DIAGNOSIS — R278 Other lack of coordination: Secondary | ICD-10-CM

## 2017-04-01 NOTE — Telephone Encounter (Signed)
Let pt know OT's recs and if he wishes to see PT, let's refer him. TY.
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
## 2017-04-01 NOTE — Telephone Encounter (Signed)
Dr. Carmelia RollerWendling,  [name] has described what may to be higher-level vestibular deficits s/p CVA.  He may benefit from a physical therapy referral to further assess and address prn in prep for return to work.  If you agree, please send a referral for physical therapy via Epic.  Thank you,  Willa FraterAngela Tieara Flitton, OTR/L [facility] [address] [place], [place]  [address] [phone] phone [phone] 04/01/17 12:47 PM

## 2017-04-02 ENCOUNTER — Ambulatory Visit: Payer: BLUE CROSS/BLUE SHIELD

## 2017-04-02 NOTE — Telephone Encounter (Signed)
Patient agreed to PT referral/have put in. He did want PCP to know he has been having nose bleeds a couple times per week.

## 2017-04-02 NOTE — Addendum Note (Signed)
Addended by: Scharlene GlossEWING, ROBIN B on: 04/02/2017 08:13 AM   Modules accepted: Orders

## 2017-04-06 ENCOUNTER — Ambulatory Visit: Payer: BLUE CROSS/BLUE SHIELD | Admitting: Speech Pathology

## 2017-04-06 ENCOUNTER — Encounter: Payer: Self-pay | Admitting: Speech Pathology

## 2017-04-06 ENCOUNTER — Other Ambulatory Visit: Payer: Self-pay

## 2017-04-06 ENCOUNTER — Institutional Professional Consult (permissible substitution): Payer: BLUE CROSS/BLUE SHIELD | Admitting: Pulmonary Disease

## 2017-04-06 ENCOUNTER — Ambulatory Visit: Payer: BLUE CROSS/BLUE SHIELD | Attending: Family Medicine | Admitting: Occupational Therapy

## 2017-04-06 ENCOUNTER — Encounter: Payer: Self-pay | Admitting: Occupational Therapy

## 2017-04-06 DIAGNOSIS — R2681 Unsteadiness on feet: Secondary | ICD-10-CM | POA: Insufficient documentation

## 2017-04-06 DIAGNOSIS — F8081 Childhood onset fluency disorder: Secondary | ICD-10-CM | POA: Insufficient documentation

## 2017-04-06 DIAGNOSIS — R278 Other lack of coordination: Secondary | ICD-10-CM

## 2017-04-06 DIAGNOSIS — M6281 Muscle weakness (generalized): Secondary | ICD-10-CM | POA: Diagnosis present

## 2017-04-06 DIAGNOSIS — I69351 Hemiplegia and hemiparesis following cerebral infarction affecting right dominant side: Secondary | ICD-10-CM | POA: Diagnosis not present

## 2017-04-06 DIAGNOSIS — R531 Weakness: Secondary | ICD-10-CM

## 2017-04-06 DIAGNOSIS — R2689 Other abnormalities of gait and mobility: Secondary | ICD-10-CM | POA: Insufficient documentation
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
## 2017-04-08 ENCOUNTER — Encounter: Payer: Self-pay | Admitting: Physical Therapy

## 2017-04-08 ENCOUNTER — Encounter: Payer: Self-pay | Admitting: Occupational Therapy

## 2017-04-08 ENCOUNTER — Ambulatory Visit: Payer: BLUE CROSS/BLUE SHIELD | Admitting: Physical Therapy

## 2017-04-08 ENCOUNTER — Ambulatory Visit: Payer: BLUE CROSS/BLUE SHIELD | Admitting: Occupational Therapy

## 2017-04-08 DIAGNOSIS — R2681 Unsteadiness on feet: Secondary | ICD-10-CM

## 2017-04-08 DIAGNOSIS — R278 Other lack of coordination: Secondary | ICD-10-CM

## 2017-04-08 DIAGNOSIS — M6281 Muscle weakness (generalized): Secondary | ICD-10-CM

## 2017-04-08 DIAGNOSIS — I69351 Hemiplegia and hemiparesis following cerebral infarction affecting right dominant side: Secondary | ICD-10-CM

## 2017-04-08 DIAGNOSIS — R2689 Other abnormalities of gait and mobility: Secondary | ICD-10-CM
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
## 2017-04-09 ENCOUNTER — Ambulatory Visit: Payer: BLUE CROSS/BLUE SHIELD | Admitting: *Deleted

## 2017-04-13 ENCOUNTER — Encounter: Payer: BLUE CROSS/BLUE SHIELD | Admitting: *Deleted

## 2017-04-13 ENCOUNTER — Encounter: Payer: BLUE CROSS/BLUE SHIELD | Admitting: Occupational Therapy

## 2017-04-15 ENCOUNTER — Ambulatory Visit: Payer: BLUE CROSS/BLUE SHIELD | Admitting: Occupational Therapy

## 2017-04-15 ENCOUNTER — Ambulatory Visit: Payer: BLUE CROSS/BLUE SHIELD | Admitting: Speech Pathology

## 2017-04-20 ENCOUNTER — Ambulatory Visit: Payer: BLUE CROSS/BLUE SHIELD

## 2017-04-29 ENCOUNTER — Encounter: Payer: BLUE CROSS/BLUE SHIELD | Admitting: Speech Pathology

## 2017-04-29 ENCOUNTER — Ambulatory Visit: Payer: BLUE CROSS/BLUE SHIELD | Admitting: Physical Therapy

## 2017-04-30 ENCOUNTER — Ambulatory Visit: Payer: BLUE CROSS/BLUE SHIELD | Admitting: Physical Therapy

## 2017-05-06 ENCOUNTER — Ambulatory Visit: Payer: BLUE CROSS/BLUE SHIELD | Admitting: Physical Therapy

## 2017-05-10 ENCOUNTER — Ambulatory Visit (INDEPENDENT_AMBULATORY_CARE_PROVIDER_SITE_OTHER): Payer: BLUE CROSS/BLUE SHIELD | Admitting: Family Medicine

## 2017-05-10 ENCOUNTER — Other Ambulatory Visit: Payer: Self-pay

## 2017-05-10 ENCOUNTER — Ambulatory Visit: Payer: Self-pay

## 2017-05-10 ENCOUNTER — Emergency Department (HOSPITAL_BASED_OUTPATIENT_CLINIC_OR_DEPARTMENT_OTHER)
Admission: EM | Admit: 2017-05-10 | Discharge: 2017-05-10 | Disposition: A | Discharge status: Home / Self Care | Payer: BLUE CROSS/BLUE SHIELD | Attending: Emergency Medicine | Admitting: Emergency Medicine

## 2017-05-10 ENCOUNTER — Encounter: Payer: Self-pay | Admitting: Physical Therapy

## 2017-05-10 ENCOUNTER — Encounter (HOSPITAL_BASED_OUTPATIENT_CLINIC_OR_DEPARTMENT_OTHER): Payer: Self-pay

## 2017-05-10 ENCOUNTER — Encounter: Payer: Self-pay | Admitting: Family Medicine

## 2017-05-10 VITALS — BP 132/88 | HR 71 | Temp 98.0°F | Ht 71.0 in | Wt 227.5 lb

## 2017-05-10 DIAGNOSIS — K921 Melena: Secondary | ICD-10-CM | POA: Insufficient documentation

## 2017-05-10 DIAGNOSIS — Z5321 Procedure and treatment not carried out due to patient leaving prior to being seen by health care provider: Secondary | ICD-10-CM | POA: Diagnosis not present

## 2017-05-10 LAB — COMPREHENSIVE METABOLIC PANEL
ALBUMIN: 4 g/dL (ref 3.5–5.0)
ALK PHOS: 99 U/L (ref 38–126)
ALT: 48 U/L (ref 17–63)
AST: 31 U/L (ref 15–41)
Anion gap: 6 (ref 5–15)
BUN: 14 mg/dL (ref 6–20)
CHLORIDE: 106 mmol/L (ref 101–111)
CO2: 26 mmol/L (ref 22–32)
CREATININE: 1.23 mg/dL (ref 0.61–1.24)
Calcium: 8.7 mg/dL — ABNORMAL LOW (ref 8.9–10.3)
GFR calc non Af Amer: >60 mL/min (ref >60)
GLUCOSE: 94 mg/dL (ref 65–99)
Potassium: 4 mmol/L (ref 3.5–5.1)
SODIUM: 138 mmol/L (ref 135–145)
Total Bilirubin: 0.5 mg/dL (ref 0.3–1.2)
Total Protein: 7.2 g/dL (ref 6.5–8.1)

## 2017-05-10 LAB — CBC WITH DIFFERENTIAL/PLATELET
BASOS PCT: 0.8 % (ref 0.0–3.0)
Basophils Absolute: 0.1 10*3/uL (ref 0.0–0.1)
EOS ABS: 0.2 10*3/uL (ref 0.0–0.7)
Eosinophils Relative: 3.2 % (ref 0.0–5.0)
HEMATOCRIT: 40.9 % (ref 39.0–52.0)
Hemoglobin: 13.2 g/dL (ref 13.0–17.0)
LYMPHS ABS: 3 10*3/uL (ref 0.7–4.0)
LYMPHS PCT: 39.4 % (ref 12.0–46.0)
MCHC: 32.3 g/dL (ref 30.0–36.0)
MCV: 82.8 fl (ref 78.0–100.0)
Monocytes Absolute: 0.6 10*3/uL (ref 0.1–1.0)
Monocytes Relative: 8 % (ref 3.0–12.0)
NEUTROS ABS: 3.8 10*3/uL (ref 1.4–7.7)
NEUTROS PCT: 48.6 % (ref 43.0–77.0)
PLATELETS: 270 10*3/uL (ref 150.0–400.0)
RBC: 4.94 Mil/uL (ref 4.22–5.81)
RDW: 14.7 % (ref 11.5–15.5)
WBC: 7.7 10*3/uL (ref 4.0–10.5)

## 2017-05-10 NOTE — ED Notes (Signed)
Pt not in ED WR when called for tx room 

## 2017-05-10 NOTE — Progress Notes (Signed)
Pre visit review using our clinic review tool, if applicable. No additional management support is needed unless otherwise documented below in the visit note. 

## 2017-05-10 NOTE — ED Notes (Signed)
Pt not in ED WR when called for tx area 

## 2017-05-10 NOTE — ED Triage Notes (Signed)
Pt c/o day 2 of black stools-NAD-steady gait

## 2017-05-10 NOTE — Therapy (Signed)
[place] [address] [place], [place], [address] Phone: [phone]   Fax:  [phone]  Patient Details  Name: [name] MRN: [id number] Date of Birth: [birth year] Referring Provider:  No ref. provider found  Encounter Date: 05/10/2017  PHYSICAL THERAPY DISCHARGE SUMMARY  Visits from Start of Care: 1 (eval only)  Current functional level related to goals / functional outcomes: See eval     Remaining deficits: See eval-pt cancelled remaining appointments due to insurance being cancelled.  Did not return after PT eval.   Education / Equipment: NA  Plan: Patient agrees to discharge.  Patient goals were not met. Patient is being discharged due to the patient's request.  ?????       Amberlie Gaillard W. 05/10/2017, 10:52 AM  Mady Haagensen, PT 05/10/17 10:53 AM Phone: [phone] Fax: [place] [address] [place], [place], [address] Phone: [phone]   Fax:  [phone]

## 2017-05-10 NOTE — ED Notes (Signed)
While drawing blood pt bend arm , when instructed pt to straighten arm pt demanding/ yelling at me to remove needle, needle removed only one tube of blood drawn
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
## 2017-05-10 NOTE — Telephone Encounter (Signed)
FYI

## 2017-05-10 NOTE — Telephone Encounter (Signed)
  Reason for Disposition . Tarry or jet black-colored stool (not dark green)  Answer Assessment - Initial Assessment Questions 1. COLOR: "What color is it?" "Is that color in part or all of the stool?"     [name] - loose 2. ONSET: "When was the unusual color first noted?"     Friday 3. CAUSE: "Have you eaten any food or taken any medicine of this color?" (See listing in BACKGROUND)     No new food or medicine 4. OTHER SYMPTOMS: "Do you have any other symptoms?" (e.g., diarrhea, jaundice, abdominal pain, fever).     Abdomin feels full. 2 black loose stools.  Answer Assessment - Initial Assessment Questions 1. APPEARANCE of BLOOD: "What color is it?" "Is it passed separately, on the surface of the stool, or mixed in with the stool?"      No  2. AMOUNT: "How much blood was passed?"      No bright red blood 3. FREQUENCY: "How many times has blood been passed with the stools?"      2 loose, black stools 4. ONSET: "When was the blood first seen in the stools?" (Days or weeks)      Friday 5. DIARRHEA: "Is there also some diarrhea?" If so, ask: "How many diarrhea stools were passed in past 24 hours?"      4 6. CONSTIPATION: "Do you have constipation?" If so, "How bad is it?"     No 7. RECURRENT SYMPTOMS: "Have you had blood in your stools before?" If so, ask: "When was the last time?" and "What happened that time?"      No 8. BLOOD THINNERS: "Do you take any blood thinners?" (e.g., Coumadin/warfarin, Pradaxa/dabigatran, aspirin)     ASA and Plavix 9. OTHER SYMPTOMS: "Do you have any other symptoms?"  (e.g., abdominal pain, vomiting, dizziness, fever)     Stomach is "sore." 10. PREGNANCY: "Is there any chance you are pregnant?" "When was your last menstrual period?"       No  Protocols used: RECTAL BLEEDING-A-AH, STOOLS - UNUSUAL COLOR-A-AH

## 2017-05-10 NOTE — Patient Instructions (Addendum)
Stop your Plavix for now until we can get you in to a GI specialist.   Avoid things like NSAIDs and aspirin.   Drink lots of fluids.  If you start having worsening dizziness, lightheadedness or bleeding, seek immediate care.  If you don't hear anything about your referral in the next couple days, seek care.  Let us know if you need anything.

## 2017-05-11 ENCOUNTER — Ambulatory Visit (INDEPENDENT_AMBULATORY_CARE_PROVIDER_SITE_OTHER): Payer: BLUE CROSS/BLUE SHIELD | Admitting: Internal Medicine

## 2017-05-11 ENCOUNTER — Institutional Professional Consult (permissible substitution): Payer: BLUE CROSS/BLUE SHIELD | Admitting: Pulmonary Disease

## 2017-05-11 ENCOUNTER — Ambulatory Visit: Payer: BLUE CROSS/BLUE SHIELD | Admitting: Physical Therapy

## 2017-05-11 ENCOUNTER — Encounter: Payer: Self-pay | Admitting: Internal Medicine

## 2017-05-11 VITALS — BP 122/80 | HR 74 | Ht 71.0 in | Wt 229.0 lb

## 2017-05-11 DIAGNOSIS — J302 Other seasonal allergic rhinitis: Secondary | ICD-10-CM | POA: Diagnosis not present

## 2017-05-11 DIAGNOSIS — I82431 Acute embolism and thrombosis of right popliteal vein: Secondary | ICD-10-CM

## 2017-05-11 DIAGNOSIS — G4733 Obstructive sleep apnea (adult) (pediatric): Secondary | ICD-10-CM

## 2017-05-11 DIAGNOSIS — I639 Cerebral infarction, unspecified: Secondary | ICD-10-CM | POA: Diagnosis not present

## 2017-05-11 DIAGNOSIS — J3089 Other allergic rhinitis: Secondary | ICD-10-CM | POA: Diagnosis not present

## 2017-05-11 NOTE — Assessment & Plan Note (Signed)
Tentative diagnosis, high probability based on history and physical.  Educational discussion done.  This problem precedes his stroke. Plan-we are scheduling sleep study and he will call for results, anticipating if appropriate that we can start therapy before his return visit.

## 2017-05-11 NOTE — Assessment & Plan Note (Signed)
He has a small polyp in the right nostril and changes consistent with allergic rhinitis.  For visible polyp is not significantly obstructive but the rhinitis may be, at times.  We discussed use of Flonase and antihistamines.  Compromise of his nasal airway will increase likelihood of obstructive sleep apnea.

## 2017-05-11 NOTE — Patient Instructions (Signed)
Order- schedule unattended home sleep test     Dx OSA   Please call me for results and recommendation about 2 weeks after your sleep test. If appropriate, we might be able to start treatment before we get you back.

## 2017-05-11 NOTE — Assessment & Plan Note (Signed)
I am not clear if he is thought to have had a hypercoagulable state.  He is maintained now on Plavix.  Problem is managed elsewhere.
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
## 2017-05-11 NOTE — Assessment & Plan Note (Signed)
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  He continues Plavix.

## 2017-05-13 ENCOUNTER — Ambulatory Visit: Payer: BLUE CROSS/BLUE SHIELD | Admitting: Physical Therapy

## 2017-05-18 ENCOUNTER — Ambulatory Visit: Payer: BLUE CROSS/BLUE SHIELD | Admitting: Physical Therapy

## 2017-05-20 ENCOUNTER — Ambulatory Visit: Payer: BLUE CROSS/BLUE SHIELD | Admitting: Physical Therapy

## 2017-05-31 ENCOUNTER — Telehealth: Payer: Self-pay | Admitting: Family Medicine

## 2017-05-31 DIAGNOSIS — G4733 Obstructive sleep apnea (adult) (pediatric): Secondary | ICD-10-CM | POA: Diagnosis not present

## 2017-05-31 NOTE — Telephone Encounter (Signed)
Copied from CRM [phone]. Topic: Quick Communication - See Telephone Encounter >> May 31, 2017  8:56 AM Valentina LucksMatos, Jackelin wrote: CRM for notification. See Telephone encounter for:  05/31/17.   Pt dropped off document to be filled out by provider Francesco Sor([employer] [facility](FMLA) 2 pages that has front and back). Pt would like to be called when document ready at tel [phone]. Pt states would like it done ASAP since job requested at last min. (Pt was informed about 5-8 business days- pt understood). Document put at front office tray.

## 2017-06-01 ENCOUNTER — Other Ambulatory Visit (HOSPITAL_COMMUNITY): Payer: Self-pay | Admitting: Interventional Radiology

## 2017-06-01 DIAGNOSIS — I6602 Occlusion and stenosis of left middle cerebral artery: Secondary | ICD-10-CM

## 2017-06-01 DIAGNOSIS — G4733 Obstructive sleep apnea (adult) (pediatric): Secondary | ICD-10-CM | POA: Diagnosis not present

## 2017-06-02 NOTE — Telephone Encounter (Signed)
Received FMLA/STD paperwork from [employer] [facility] [employer] [employer], will complete as much as possible; then forward to provider/SLS 01/30

## 2017-06-04 ENCOUNTER — Other Ambulatory Visit: Payer: Self-pay | Admitting: *Deleted

## 2017-06-04 DIAGNOSIS — G4733 Obstructive sleep apnea (adult) (pediatric): Secondary | ICD-10-CM

## 2017-06-07 ENCOUNTER — Ambulatory Visit: Payer: BLUE CROSS/BLUE SHIELD | Admitting: Neurology

## 2017-06-09 NOTE — Telephone Encounter (Signed)
Pt came in office wanting to know if document was ready, informed pt document not ready and that we will call pt at tel mentioned below [facility]. Please advise when ready, since pt states is needing documents ASAP.

## 2017-06-10 ENCOUNTER — Ambulatory Visit (HOSPITAL_COMMUNITY)
Admission: RE | Admit: 2017-06-10 | Discharge: 2017-06-10 | Disposition: A | Discharge status: Home / Self Care | Payer: BLUE CROSS/BLUE SHIELD | Source: Ambulatory Visit | Attending: Interventional Radiology | Admitting: Interventional Radiology

## 2017-06-10 ENCOUNTER — Encounter (HOSPITAL_COMMUNITY): Payer: Self-pay

## 2017-06-10 ENCOUNTER — Ambulatory Visit (HOSPITAL_COMMUNITY): Payer: BLUE CROSS/BLUE SHIELD

## 2017-06-10 DIAGNOSIS — I6602 Occlusion and stenosis of left middle cerebral artery: Secondary | ICD-10-CM | POA: Insufficient documentation

## 2017-06-10 MED ORDER — IOPAMIDOL (ISOVUE-370) INJECTION 76%
INTRAVENOUS | Status: AC
  Administered 2017-06-10: 50 mL
  Filled 2017-06-10: qty 50

## 2017-06-14 ENCOUNTER — Encounter: Payer: Self-pay | Admitting: Occupational Therapy

## 2017-06-14 NOTE — Therapy (Signed)
[facility] [address] [place], [place], [address] Phone: [phone]   Fax:  [phone]  Patient Details  Name: [name] MRN: [id number] Date of Birth: [birth year] Referring Provider:  No ref. provider found  Encounter Date: 06/14/2017  OCCUPATIONAL THERAPY DISCHARGE SUMMARY  Visits from Start of Care: 9  Current functional level related to goals / functional outcomes: OT Short Term Goals - 04/01/17 1115      OT SHORT TERM GOAL #1   Title  Pt will be independent with initial HEP.--check STGs 03/25/17    Status  Achieved      OT SHORT TERM GOAL #2   Title  Pt will using RUE as dominant UE for ADLs 100% of the time.      Status  Achieved 03/18/17:  met per pt report      OT SHORT TERM GOAL #3   Title  Pt will be able to write a short paragraph with 100% legibility in reasonable amount of time.    Time  4    Period  Weeks    Status  Achieved      OT SHORT TERM GOAL #4   Title  Pt will improve coordination for ADLs as shown by improving time on 9-hole peg test by at least 8sec with R hand.    Baseline  R-38.75sec    Time  4    Period  Weeks    Status  Achieved 03/30/17:  27.75sec      OT SHORT TERM GOAL #5   Title  Pt will improve R grip strength by at least 10lbs to assist in lifting/gripping/opening containers.    Baseline  R-35lbs    Time  4    Period  Weeks    Status  Achieved 04/01/17:  55lbs      OT SHORT TERM GOAL #6   Title  Pt will be able to complete fasteners (buttons, tying shoes) in reasonable amount of time.    Time  4    Period  Weeks    Status  Achieved      OT Long Term Goals - 04/01/17 1117      OT LONG TERM GOAL #1   Title  Pt will be independent with updated HEP.--check LTGs 04/24/17    Time  8    Period  Weeks    Status Met     OT LONG TERM GOAL #2   Title  Pt will improve RUE functional use as shown by improving score on Upper Extremity Functional Index by to at  least 69/80    Baseline  49/80    Time  8    Period  Weeks    Status  Not met, unable to reassess     OT LONG TERM GOAL #3   Title  Pt will improve coordination for ADLs as shown by improving time on 9-hole peg test by at least 15sec with R hand.    Baseline  R-38.75sec    Time  8    Period  Weeks    Status   Not met, unable to reassess     OT LONG TERM GOAL #4   Title  Pt will be able to lift/carry at least 45lbs with BUEs for simulated work tasks.    Time  8    Period  Weeks    Status   Not met, unable to reassess  (able to perform with 35lbs)  OT LONG TERM GOAL #5   Title  Pt will improve R grip strength by at least 15lbs to assist in lifting/gripping/opening containers.--Met 04/01/17  updated to at least 65lbs grip strength    Baseline  R-35lbs    Time  8    Period  Weeks    Status  Revised 04/01/17:  55lbs.   Not met, unable to reassess     All goals not met/unable to reassess due to pt not returning since last treatment.     Remaining deficits: decr coordination, decr strength affecting RUE functional use   Education / Equipment: Pt was instructed in HEP and verbalized understanding.  Plan: Patient agrees to discharge.  Patient goals were partially met. Patient is being discharged due to not returning since the last visit.  ?????        [facility] 06/14/2017, 4:56 PM  [facility] [address] [place], [place], [address] Phone: [phone]   Fax:  Coffee, OTR/L [facility] [address] [place], [place]  [address] [phone] phone [phone] 06/14/17 4:56 PM

## 2017-06-15 ENCOUNTER — Telehealth: Payer: Self-pay | Admitting: Internal Medicine

## 2017-06-15 ENCOUNTER — Ambulatory Visit (INDEPENDENT_AMBULATORY_CARE_PROVIDER_SITE_OTHER): Payer: BLUE CROSS/BLUE SHIELD | Admitting: Internal Medicine

## 2017-06-15 ENCOUNTER — Encounter: Payer: Self-pay | Admitting: Internal Medicine

## 2017-06-15 ENCOUNTER — Encounter (INDEPENDENT_AMBULATORY_CARE_PROVIDER_SITE_OTHER): Payer: Self-pay

## 2017-06-15 VITALS — BP 124/90 | HR 72 | Ht 69.25 in | Wt 228.0 lb

## 2017-06-15 DIAGNOSIS — K921 Melena: Secondary | ICD-10-CM

## 2017-06-15 DIAGNOSIS — K219 Gastro-esophageal reflux disease without esophagitis: Secondary | ICD-10-CM | POA: Diagnosis not present

## 2017-06-15 MED ORDER — PEG-KCL-NACL-NASULF-NA ASC-C 140 G PO SOLR: 1.0000 | Freq: Once | ORAL | 0 refills | Status: AC

## 2017-06-15 MED ORDER — PANTOPRAZOLE SODIUM 40 MG PO TBEC: 40.0000 mg | DELAYED_RELEASE_TABLET | Freq: Every day | ORAL | 6 refills | Status: DC

## 2017-06-15 NOTE — Patient Instructions (Signed)
We have sent the following medications to your pharmacy for you to pick up at your convenience: Pantoprazole  You have been scheduled for an endoscopy and colonoscopy. Please follow the written instructions given to you at your visit today. Please pick up your prep supplies at the pharmacy within the next 1-3 days. If you use inhalers (even only as needed), please bring them with you on the day of your procedure. Your physician has requested that you go to www.startemmi.com and enter the access code given to you at your visit today. This web site gives a general overview about your procedure. However, you should still follow specific instructions given to you by our office regarding your preparation for the procedure.   
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
## 2017-06-18 NOTE — Telephone Encounter (Signed)
Activated another copay card for patient and faxed it to his pharmacy

## 2017-06-21 ENCOUNTER — Ambulatory Visit (INDEPENDENT_AMBULATORY_CARE_PROVIDER_SITE_OTHER): Payer: BLUE CROSS/BLUE SHIELD | Admitting: Neurology

## 2017-06-21 ENCOUNTER — Encounter: Payer: Self-pay | Admitting: Neurology

## 2017-06-21 VITALS — BP 140/92 | HR 73 | Wt 232.0 lb

## 2017-06-21 DIAGNOSIS — I82431 Acute embolism and thrombosis of right popliteal vein: Secondary | ICD-10-CM | POA: Diagnosis not present

## 2017-06-21 DIAGNOSIS — K921 Melena: Secondary | ICD-10-CM

## 2017-06-21 DIAGNOSIS — I10 Essential (primary) hypertension: Secondary | ICD-10-CM | POA: Diagnosis not present

## 2017-06-21 DIAGNOSIS — I6602 Occlusion and stenosis of left middle cerebral artery: Secondary | ICD-10-CM | POA: Diagnosis not present

## 2017-06-21 DIAGNOSIS — E785 Hyperlipidemia, unspecified: Secondary | ICD-10-CM | POA: Diagnosis not present

## 2017-06-21 NOTE — Telephone Encounter (Signed)
Pt left another FMLA form to be filled out. Gave to Robin and Dr. Carmelia RollerWendling.

## 2017-06-21 NOTE — Patient Instructions (Addendum)
-   given your stroke has been 6 months now and recent possible GI bleeding, please discontinue ASA but continue plavix and lipitor for stroke prevention - follow up with GI doctor to rule out GI bleeding - get up slowly to avoid dizziness and fall - check BP at home and record - we can help for some disability form or FMLA if needed - Follow up with your primary care physician for stroke risk factor modification. Recommend maintain blood pressure goal <130/80, diabetes with hemoglobin A1c goal below 7.0% and lipids with LDL cholesterol goal below 70 mg/dL.   - healthy diet and continue self home exercise - follow up in 3 months.
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
## 2017-06-21 NOTE — Telephone Encounter (Signed)
Pt came in office wanting to know status of paperwork that was dropped off on 05/31/17. Please advise or call pt asap. He said his job is still calling about these papers.

## 2017-06-21 NOTE — Telephone Encounter (Signed)
Form is on [employer]'s desk .

## 2017-06-23 ENCOUNTER — Telehealth: Payer: Self-pay | Admitting: Neurology

## 2017-06-23 DIAGNOSIS — Z0289 Encounter for other administrative examinations: Secondary | ICD-10-CM

## 2017-06-23 NOTE — Telephone Encounter (Signed)
Patient is calling to see if FMLA papers were received. Please call patient when ready for pickup.

## 2017-06-23 NOTE — Telephone Encounter (Signed)
R/c payment inform pt, our policy is payment first and that we have 7 to 10 days to fill the form out.

## 2017-06-23 NOTE — Telephone Encounter (Signed)
Received FMLA paperwork back incomplete, regarding absences and intermittent time needed. I have informed provider's MA that provider will need to complete paperwork/SLS 02/20

## 2017-06-24 NOTE — Telephone Encounter (Signed)
Payment receive for forms on 06/23/2017. Pt has to sign the release form on the FMLA forms before it can be fax. Given to medical records.

## 2017-08-02 ENCOUNTER — Telehealth: Payer: Self-pay | Admitting: Internal Medicine

## 2017-08-02 DIAGNOSIS — G4733 Obstructive sleep apnea (adult) (pediatric): Secondary | ICD-10-CM

## 2017-08-02 NOTE — Telephone Encounter (Signed)
Pt has been scheduled for 11-01-17 at 9:30am for follow up of CPAP set up. Pt aware and will contact us sooner if questions/concerns about apt or CPAP needs.

## 2017-08-09 ENCOUNTER — Encounter: Payer: Self-pay | Admitting: Internal Medicine

## 2017-08-09 ENCOUNTER — Ambulatory Visit (INDEPENDENT_AMBULATORY_CARE_PROVIDER_SITE_OTHER): Payer: BLUE CROSS/BLUE SHIELD | Admitting: Internal Medicine

## 2017-08-09 DIAGNOSIS — J3089 Other allergic rhinitis: Secondary | ICD-10-CM

## 2017-08-09 DIAGNOSIS — G4733 Obstructive sleep apnea (adult) (pediatric): Secondary | ICD-10-CM

## 2017-08-09 DIAGNOSIS — J302 Other seasonal allergic rhinitis: Secondary | ICD-10-CM
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
## 2017-08-09 NOTE — Patient Instructions (Signed)
We will provide you the phone number for [facility], so you can call them about starting CPAP to treat your sleep apnea.  Please call if we can help.

## 2017-08-09 NOTE — Assessment & Plan Note (Signed)
We are giving him the phone number so he can contact [facility] directly to get started with CPAP.  I think he has a good understanding and is motivated to make it work.  We did discuss alternative therapies, comfort issues including mask fit, sleep hygiene and responsibility to be alert while driving.

## 2017-08-09 NOTE — Assessment & Plan Note (Signed)
Ok to use flonase/ claritin if needed during pollen season to maintain CPAP.

## 2017-08-11 ENCOUNTER — Other Ambulatory Visit: Payer: Self-pay

## 2017-08-11 MED ORDER — PANTOPRAZOLE SODIUM 40 MG PO TBEC: 40.0000 mg | DELAYED_RELEASE_TABLET | Freq: Every day | ORAL | 2 refills | Status: DC

## 2017-08-16 ENCOUNTER — Encounter: Payer: BLUE CROSS/BLUE SHIELD | Admitting: Internal Medicine

## 2017-09-01 ENCOUNTER — Ambulatory Visit: Payer: BLUE CROSS/BLUE SHIELD | Admitting: Family Medicine

## 2017-09-01 ENCOUNTER — Encounter: Payer: Self-pay | Admitting: Family Medicine

## 2017-09-01 ENCOUNTER — Ambulatory Visit (INDEPENDENT_AMBULATORY_CARE_PROVIDER_SITE_OTHER): Payer: Self-pay | Admitting: Family Medicine

## 2017-09-01 VITALS — BP 128/92 | HR 86 | Temp 98.0°F | Ht 71.0 in | Wt 233.1 lb

## 2017-09-01 DIAGNOSIS — I10 Essential (primary) hypertension: Secondary | ICD-10-CM

## 2017-09-01 MED ORDER — AMLODIPINE BESYLATE 5 MG PO TABS: 5.0000 mg | ORAL_TABLET | Freq: Every day | ORAL | 3 refills | Status: DC

## 2017-09-01 NOTE — Patient Instructions (Addendum)
Keep the diet clean.  Stay as active as possible.   Continue the lisinopril.   Keep checking blood pressure at home. <130/80 is the goal.   Let us know if you need anything.

## 2017-09-01 NOTE — Progress Notes (Signed)
Pre visit review using our clinic review tool, if applicable. No additional management support is needed unless otherwise documented below in the visit note. 
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
## 2017-09-13 ENCOUNTER — Telehealth: Payer: Self-pay | Admitting: *Deleted

## 2017-09-13 NOTE — Telephone Encounter (Signed)
Received request for Medical records from [employer], forwarded to Jordan for email/scan/SLS 05/13    

## 2017-09-20 ENCOUNTER — Encounter: Payer: Self-pay | Admitting: Adult Health

## 2017-09-20 ENCOUNTER — Ambulatory Visit (INDEPENDENT_AMBULATORY_CARE_PROVIDER_SITE_OTHER): Payer: BLUE CROSS/BLUE SHIELD | Admitting: Adult Health

## 2017-09-20 VITALS — BP 141/90 | HR 92 | Ht 71.0 in | Wt 234.0 lb

## 2017-09-20 DIAGNOSIS — I63412 Cerebral infarction due to embolism of left middle cerebral artery: Secondary | ICD-10-CM

## 2017-09-20 DIAGNOSIS — I10 Essential (primary) hypertension: Secondary | ICD-10-CM

## 2017-09-20 DIAGNOSIS — I63411 Cerebral infarction due to embolism of right middle cerebral artery: Secondary | ICD-10-CM

## 2017-09-20 DIAGNOSIS — E785 Hyperlipidemia, unspecified: Secondary | ICD-10-CM

## 2017-09-20 DIAGNOSIS — R42 Dizziness and giddiness: Secondary | ICD-10-CM | POA: Diagnosis not present

## 2017-09-20 DIAGNOSIS — I63433 Cerebral infarction due to embolism of bilateral posterior cerebral arteries: Secondary | ICD-10-CM

## 2017-09-20 MED ORDER — CLOPIDOGREL BISULFATE 75 MG PO TABS: 75.0000 mg | ORAL_TABLET | Freq: Every day | ORAL | 3 refills | Status: DC

## 2017-09-20 NOTE — Patient Instructions (Addendum)
Continue Plavix and lipitor  for secondary stroke prevention  Start PT for continued dizziness and frequent falls  Continue to follow up with PCP regarding cholesterol and blood pressure management   Continue to monitor blood pressure at home  Maintain strict control of hypertension with blood pressure goal below 130/90, diabetes with hemoglobin A1c goal below 6.5% and cholesterol with LDL cholesterol (bad cholesterol) goal below 70 mg/dL. I also advised the patient to eat a healthy diet with plenty of whole grains, cereals, fruits and vegetables, exercise regularly and maintain ideal body weight.  Followup in the future with me in 3 months or call earlier if needed        Thank you for coming to see [place] at [facility]. I hope we have been able to provide you high quality care today.  You may receive a patient satisfaction survey over the next few weeks. We would appreciate your feedback and comments so that we may continue to improve ourselves and the health of our patients.
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
## 2017-09-20 NOTE — Progress Notes (Signed)
I have read the note, and I agree with the clinical assessment and plan.  Richard A. Sater, MD, PhD, FAAN Certified in Neurology, Clinical Neurophysiology, Sleep Medicine, Pain Medicine and Neuroimaging  [facility] [address], [address] [place], [place] [address] [phone]  

## 2017-10-13 ENCOUNTER — Ambulatory Visit: Payer: Self-pay | Admitting: Family Medicine

## 2017-10-13 ENCOUNTER — Encounter: Payer: Self-pay | Admitting: Family Medicine

## 2017-10-13 VITALS — BP 120/80 | HR 70 | Temp 98.0°F | Ht 71.0 in | Wt 232.0 lb

## 2017-10-13 DIAGNOSIS — R058 Other specified cough: Secondary | ICD-10-CM

## 2017-10-13 DIAGNOSIS — I10 Essential (primary) hypertension: Secondary | ICD-10-CM

## 2017-10-13 DIAGNOSIS — T464X5A Adverse effect of angiotensin-converting-enzyme inhibitors, initial encounter: Secondary | ICD-10-CM | POA: Insufficient documentation

## 2017-10-13 DIAGNOSIS — R05 Cough: Secondary | ICD-10-CM

## 2017-10-13 MED ORDER — LOSARTAN POTASSIUM 100 MG PO TABS: 100.0000 mg | ORAL_TABLET | Freq: Every day | ORAL | 1 refills | Status: DC

## 2017-10-13 MED ORDER — AMLODIPINE BESYLATE 5 MG PO TABS: 5.0000 mg | ORAL_TABLET | Freq: Every day | ORAL | 3 refills | Status: DC

## 2017-10-13 MED ORDER — ATORVASTATIN CALCIUM 80 MG PO TABS: 80.0000 mg | ORAL_TABLET | Freq: Every day | ORAL | 3 refills | Status: DC

## 2017-10-13 NOTE — Progress Notes (Signed)
Pre visit review using our clinic review tool, if applicable. No additional management support is needed unless otherwise documented below in the visit note. 
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
## 2017-10-13 NOTE — Patient Instructions (Signed)
Keep checking blood pressures at home every 3-4 days and keep a log.  Keep the diet clean.  Stay physically active.   Let us know if you need anything.

## 2017-11-01 ENCOUNTER — Ambulatory Visit: Payer: BLUE CROSS/BLUE SHIELD | Admitting: Internal Medicine

## 2017-11-10 ENCOUNTER — Ambulatory Visit: Payer: Self-pay | Admitting: Internal Medicine

## 2017-11-10 ENCOUNTER — Telehealth: Payer: Self-pay | Admitting: Internal Medicine

## 2017-11-10 NOTE — Telephone Encounter (Signed)
[facility] for [facility] and to follow up.

## 2017-11-12 NOTE — Telephone Encounter (Signed)
Please contact patient to inform him that we have CPAP assistance program if he does not have valid/current Insurance. Thanks.

## 2017-11-12 NOTE — Telephone Encounter (Signed)
Florentina AddisonKatie can this message be closed? Is there anything triage can do to help with this?

## 2017-11-12 NOTE — Telephone Encounter (Signed)
Called and spoke with patient, he states that he was given a phone number from his insurance to call about a machine. Patient called that number and he has been given a machine. Patient states that he doesn't need further assistance with this.   Florentina AddisonKatie, is it ok to close encounter?

## 2017-11-13 ENCOUNTER — Other Ambulatory Visit: Payer: Self-pay | Admitting: Family Medicine

## 2017-11-13 DIAGNOSIS — I10 Essential (primary) hypertension: Secondary | ICD-10-CM

## 2017-11-16 NOTE — Telephone Encounter (Signed)
Spoke with patient-he paid out of pocket for CPAP machine and supplies. Apt has been scheduled for 02/15/18 at 11:30am for CPAP follow up. Pt is aware to wear CPAP all night every night and call with any questions or concerns regarding mask fit or pressure settings. Pt does not have a local DME as he purchased CPAP online with supplies and would like to continue purchasing supplies online. Nothing further needed at this time.

## 2017-11-22 DIAGNOSIS — Z0271 Encounter for disability determination: Secondary | ICD-10-CM

## 2017-11-25 DIAGNOSIS — Z0271 Encounter for disability determination: Secondary | ICD-10-CM

## 2017-12-21 ENCOUNTER — Ambulatory Visit: Payer: Self-pay | Admitting: Adult Health

## 2017-12-21 ENCOUNTER — Encounter: Payer: Self-pay | Admitting: Adult Health

## 2017-12-21 VITALS — BP 133/95 | HR 77 | Ht 71.0 in | Wt 223.4 lb

## 2017-12-21 DIAGNOSIS — Z8673 Personal history of transient ischemic attack (TIA), and cerebral infarction without residual deficits: Secondary | ICD-10-CM

## 2017-12-21 DIAGNOSIS — I10 Essential (primary) hypertension: Secondary | ICD-10-CM

## 2017-12-21 DIAGNOSIS — E785 Hyperlipidemia, unspecified: Secondary | ICD-10-CM

## 2017-12-21 NOTE — Patient Instructions (Signed)
Continue clopidogrel 75 mg daily  and lipitor  for secondary stroke prevention  Continue to follow up with PCP regarding cholesterol and blood pressure management   Continue to monitor blood pressure at home  Maintain strict control of hypertension with blood pressure goal below 130/90, diabetes with hemoglobin A1c goal below 6.5% and cholesterol with LDL cholesterol (bad cholesterol) goal below 70 mg/dL. I also advised the patient to eat a healthy diet with plenty of whole grains, cereals, fruits and vegetables, exercise regularly and maintain ideal body weight.  Followup in the future with me in 6 months or call earlier if needed       Thank you for coming to see us at [facility]. I hope we have been able to provide you high quality care today.  You may receive a patient satisfaction survey over the next few weeks. We would appreciate your feedback and comments so that we may continue to improve ourselves and the health of our patients.  
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
## 2017-12-21 NOTE — Progress Notes (Signed)
I agree with the above plan 

## 2018-01-13 ENCOUNTER — Ambulatory Visit: Payer: Self-pay | Admitting: Family Medicine

## 2018-02-09 ENCOUNTER — Ambulatory Visit: Payer: Self-pay | Admitting: Family Medicine

## 2018-02-15 ENCOUNTER — Ambulatory Visit: Payer: Self-pay | Admitting: Internal Medicine

## 2018-02-18 ENCOUNTER — Encounter: Payer: Self-pay | Admitting: Family Medicine

## 2018-02-18 ENCOUNTER — Ambulatory Visit (INDEPENDENT_AMBULATORY_CARE_PROVIDER_SITE_OTHER): Payer: Self-pay | Admitting: Family Medicine

## 2018-02-18 DIAGNOSIS — R05 Cough: Secondary | ICD-10-CM

## 2018-02-18 DIAGNOSIS — I10 Essential (primary) hypertension: Secondary | ICD-10-CM

## 2018-02-18 DIAGNOSIS — T464X5A Adverse effect of angiotensin-converting-enzyme inhibitors, initial encounter: Secondary | ICD-10-CM

## 2018-02-18 MED ORDER — CLOPIDOGREL BISULFATE 75 MG PO TABS: 75.0000 mg | ORAL_TABLET | Freq: Every day | ORAL | 3 refills | Status: DC

## 2018-02-18 MED ORDER — AMLODIPINE BESYLATE 5 MG PO TABS: 5.0000 mg | ORAL_TABLET | Freq: Every day | ORAL | 3 refills | Status: DC

## 2018-02-18 MED ORDER — AMLODIPINE BESYLATE 10 MG PO TABS: 10.0000 mg | ORAL_TABLET | Freq: Every day | ORAL | 3 refills | Status: DC

## 2018-02-18 MED ORDER — LOSARTAN POTASSIUM 100 MG PO TABS: 100.0000 mg | ORAL_TABLET | Freq: Every day | ORAL | 1 refills | Status: DC

## 2018-02-18 MED ORDER — ATORVASTATIN CALCIUM 80 MG PO TABS: 80.0000 mg | ORAL_TABLET | Freq: Every day | ORAL | 3 refills | Status: DC

## 2018-02-18 NOTE — Patient Instructions (Addendum)
Make sure you stop the lisinopril.   Take Norvasc (amlodipine) 10 mg at night.  Take Cozaar (losartan) 100 mg in the morning.  Continue checking your blood pressure. I want your BP <130/80 consistently.   Keep cleaning up the diet, stay active.   Let us know if you need anything.
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
## 2018-02-18 NOTE — Progress Notes (Signed)
Pre visit review using our clinic review tool, if applicable. No additional management support is needed unless otherwise documented below in the visit note. 

## 2018-03-14 ENCOUNTER — Ambulatory Visit: Payer: Self-pay | Admitting: Pulmonary Disease

## 2018-04-06 ENCOUNTER — Encounter: Payer: Self-pay | Admitting: Family Medicine

## 2018-04-06 ENCOUNTER — Ambulatory Visit (INDEPENDENT_AMBULATORY_CARE_PROVIDER_SITE_OTHER): Payer: Self-pay | Admitting: Family Medicine

## 2018-04-06 VITALS — BP 128/80 | HR 82 | Temp 98.6°F | Ht 71.0 in | Wt 237.2 lb

## 2018-04-06 DIAGNOSIS — I10 Essential (primary) hypertension: Secondary | ICD-10-CM

## 2018-04-06 DIAGNOSIS — H04123 Dry eye syndrome of bilateral lacrimal glands: Secondary | ICD-10-CM
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
## 2018-04-06 NOTE — Patient Instructions (Addendum)
Keep the diet clean and stay active.  Keep checking blood pressures. Come in sooner than 3 mo if this is not getting under control in evening.  Take the Norvasc in the afternoon/evening. Take the losartan in the morning.  Artificial tears like Refresh and Systane may be used for comfort. OK to get generic version. Generally people use them every 2-4 hours, but you can use them as much as you want because there is no medication in it.  Let us know if you need anything.

## 2018-04-06 NOTE — Progress Notes (Signed)
Pre visit review using our clinic review tool, if applicable. No additional management support is needed unless otherwise documented below in the visit note. 

## 2018-05-17 ENCOUNTER — Other Ambulatory Visit: Payer: Self-pay | Admitting: Internal Medicine

## 2018-06-23 ENCOUNTER — Ambulatory Visit: Payer: Self-pay | Admitting: Adult Health

## 2018-07-07 ENCOUNTER — Ambulatory Visit (INDEPENDENT_AMBULATORY_CARE_PROVIDER_SITE_OTHER): Payer: Self-pay | Admitting: Family Medicine

## 2018-07-07 ENCOUNTER — Encounter: Payer: Self-pay | Admitting: Family Medicine

## 2018-07-07 VITALS — BP 124/82 | HR 86 | Temp 98.4°F | Ht 71.0 in | Wt 241.5 lb

## 2018-07-07 DIAGNOSIS — I6930 Unspecified sequelae of cerebral infarction: Secondary | ICD-10-CM | POA: Insufficient documentation

## 2018-07-07 DIAGNOSIS — I10 Essential (primary) hypertension: Secondary | ICD-10-CM

## 2018-07-07 NOTE — Patient Instructions (Signed)
Keep the diet clean and stay active.  Keep checking your blood pressure intermittently.  Stay on your medicine.  If you do not hear anything about your referral in the next 1-2 weeks, call our office and ask for an update.  Let us know if you need anything.
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
## 2018-08-17 ENCOUNTER — Other Ambulatory Visit: Payer: Self-pay | Admitting: Family Medicine

## 2018-08-17 DIAGNOSIS — R05 Cough: Secondary | ICD-10-CM

## 2018-08-17 DIAGNOSIS — T464X5A Adverse effect of angiotensin-converting-enzyme inhibitors, initial encounter: Secondary | ICD-10-CM

## 2018-08-17 DIAGNOSIS — I10 Essential (primary) hypertension: Secondary | ICD-10-CM
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
## 2018-12-07 IMAGING — MR MR HEAD W/O CM
9 of 10 series · 35 of 48 positions shown · non-contrast
Comparison: 12/26/2016 head CT, CTA head and neck and CT perfusion.

CLINICAL DATA: 41-year-old male with abnormal left MCA on CTA and
probable left MCA territory infarct by CT yesterday.

EXAM:
MRI HEAD WITHOUT CONTRAST
TECHNIQUE: Multiplanar, multiecho pulse sequences of the brain and surrounding
structures were obtained without intravenous contrast.

[Series 3: DWI · axial · 3.0mm · 0.94mm/px · z∈[-72,+74]mm · 9 of 100 slices shown (1 of 2)]
[im 1/100]
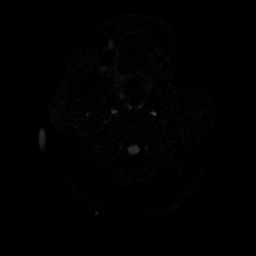
[im 13/100]
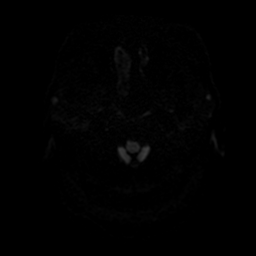
[im 25/100]
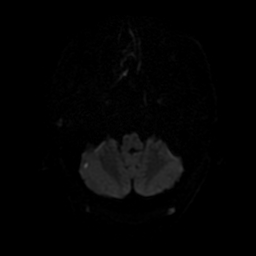
[im 38/100]
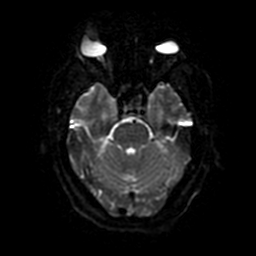
[im 50/100]
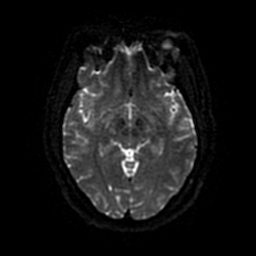
[im 62/100]
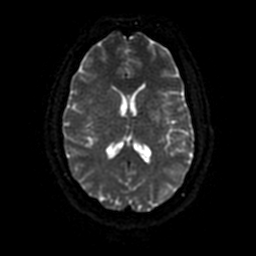
[im 75/100]
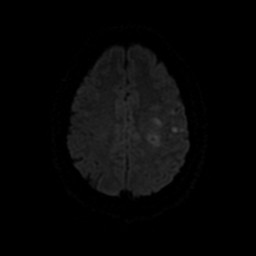
[im 87/100]
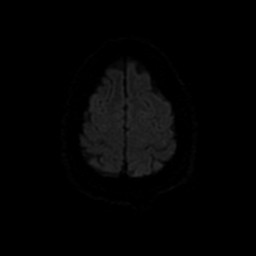
[im 100/100]
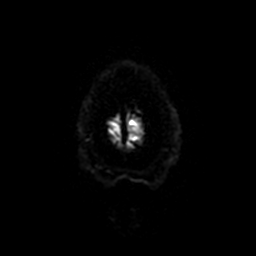

[Series 4: DWI · coronal · 4.0mm · 0.94mm/px · 7 of 74 slices shown (2 of 2)]
[im 1/74]
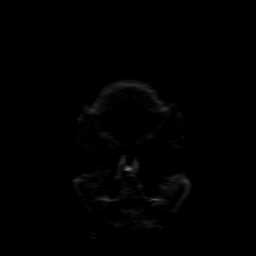
[im 13/74]
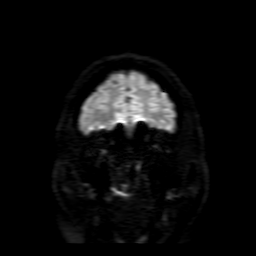
[im 25/74]
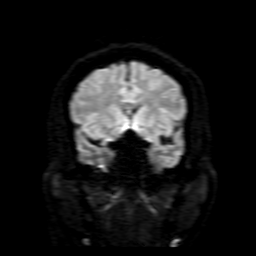
[im 37/74]
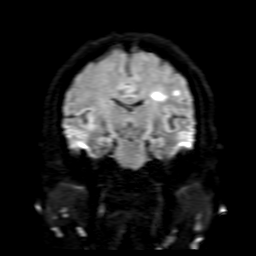
[im 49/74]
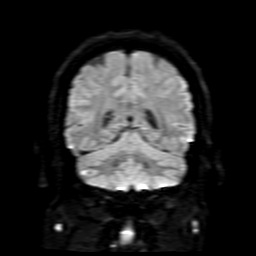
[im 61/74]
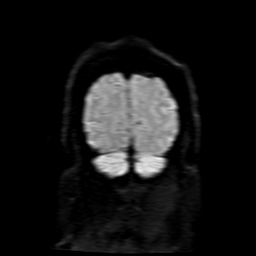
[im 74/74]
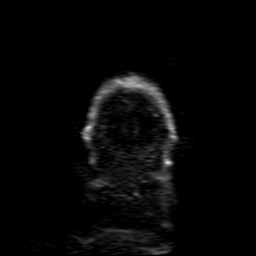

[Series 5: FLAIR · sagittal · 5.0mm · 0.47mm/px · 2 of 23 slices shown (1 of 2)]
[im 1/23]
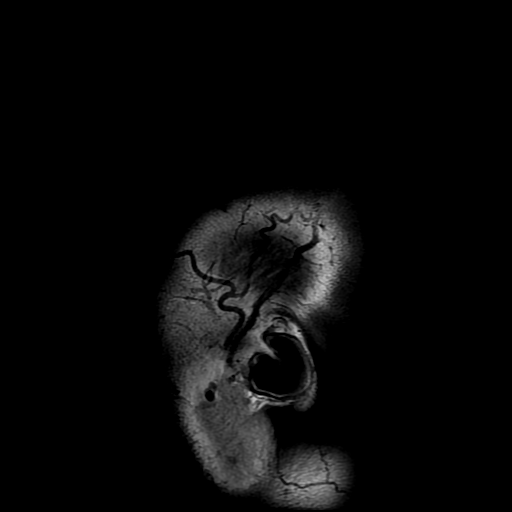
[im 23/23]
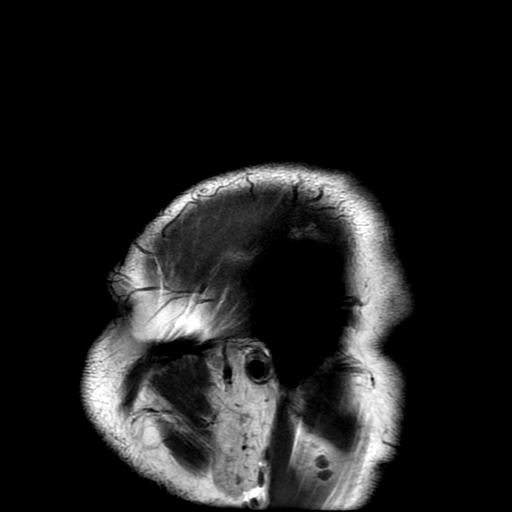

[Series 6: T2 · axial · 5.0mm · 0.47mm/px · z∈[-85,+70]mm · 3 of 27 slices shown (1 of 2)]
[im 1/27]
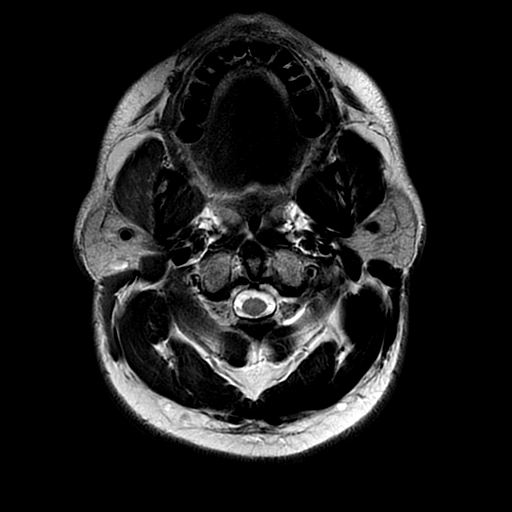
[im 14/27]
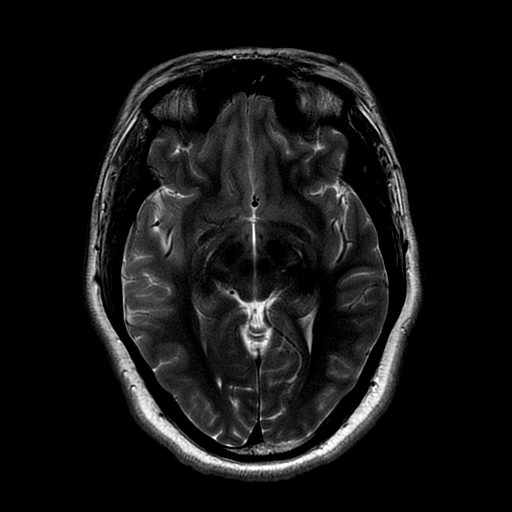
[im 27/27]
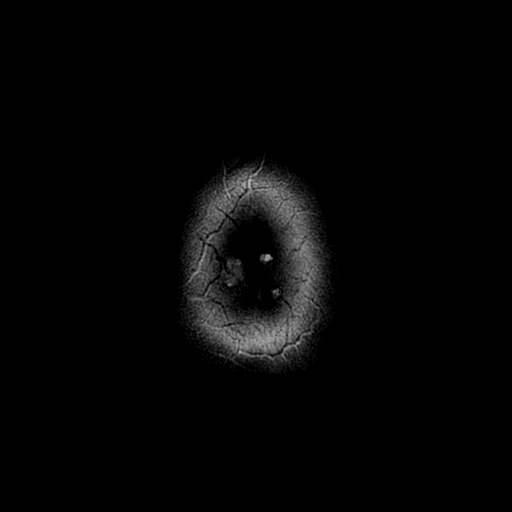

[Series 7: FLAIR · axial · 5.0mm · 0.47mm/px · z∈[-85,+70]mm · 3 of 27 slices shown (2 of 2)]
[im 1/27]
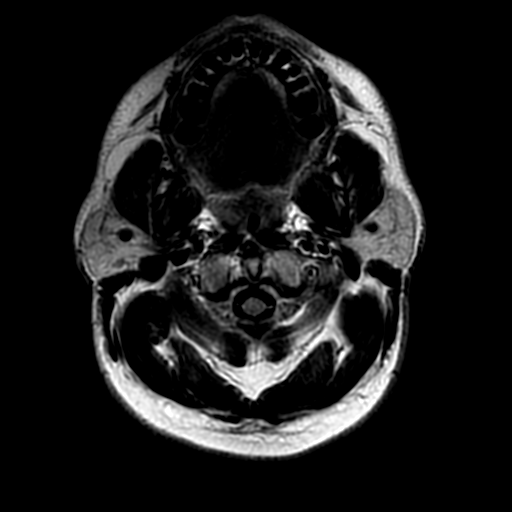
[im 14/27]
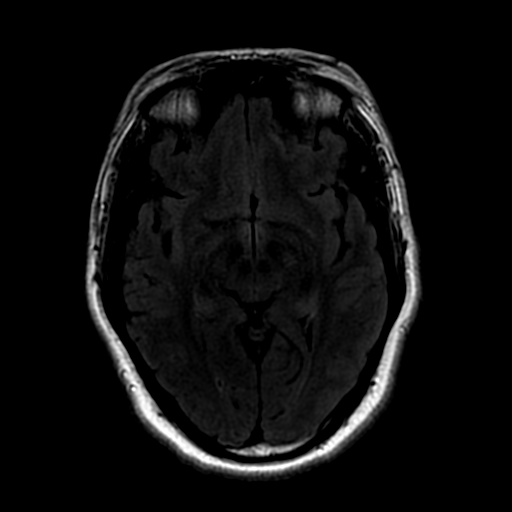
[im 27/27]
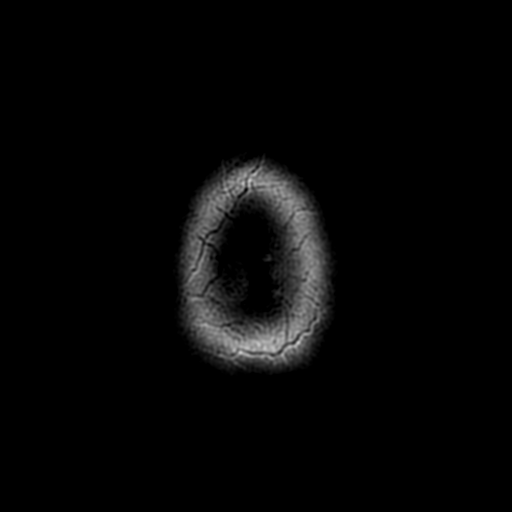

[Series 8: (person_name) · axial · 3.0mm · 0.47mm/px · 1 of 100 slices shown]
[im 1/100]
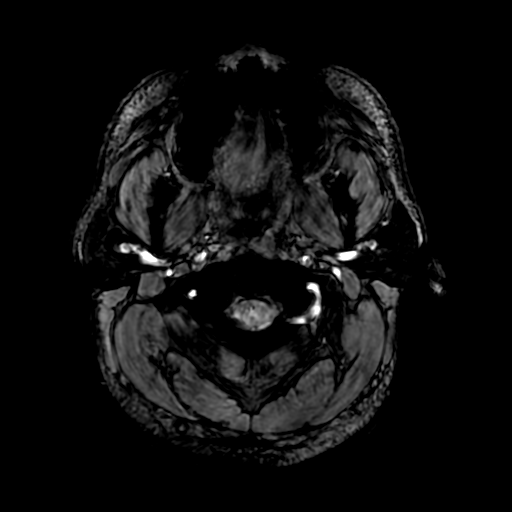

[Series 10: T2 · coronal · 5.0mm · 0.47mm/px · 2 of 25 slices shown (2 of 2)]
[im 1/25]
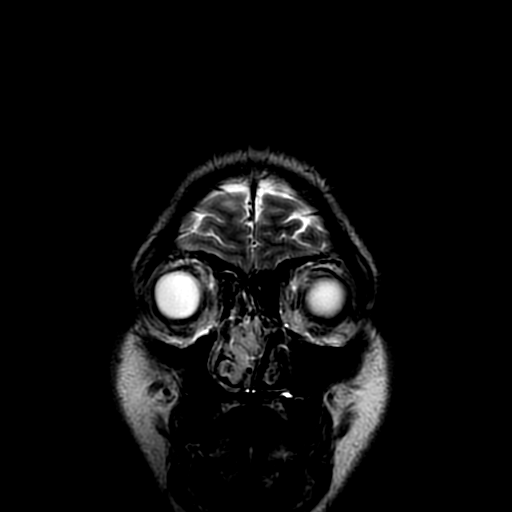
[im 25/25]
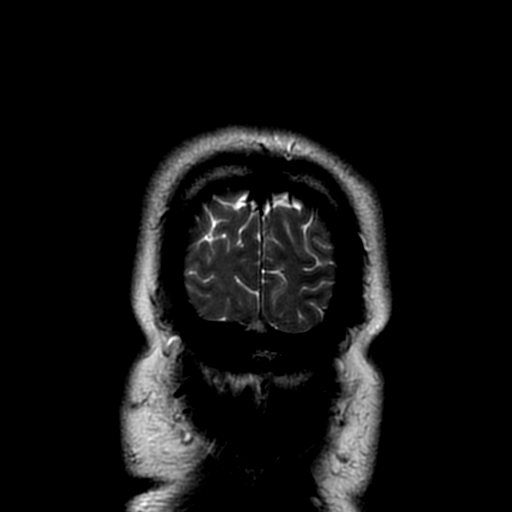

[Series 350: ADC · axial · 3.0mm · 0.94mm/px · z∈[-72,+74]mm · 5 of 50 slices shown (1 of 2)]
[im 1/50]
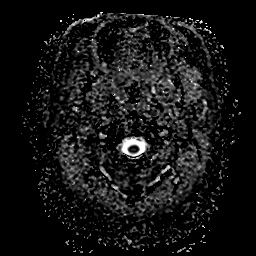
[im 13/50]
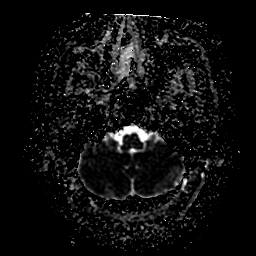
[im 25/50]
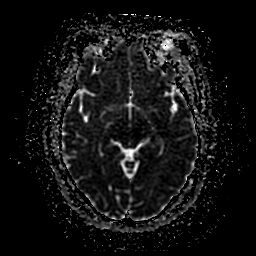
[im 37/50]
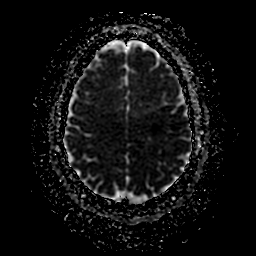
[im 50/50]
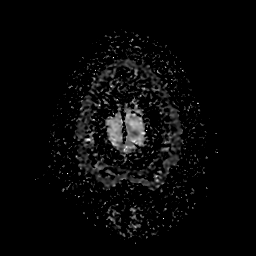

[Series 450: ADC · coronal · 4.0mm · 0.94mm/px · 3 of 37 slices shown (2 of 2)]
[im 1/37]
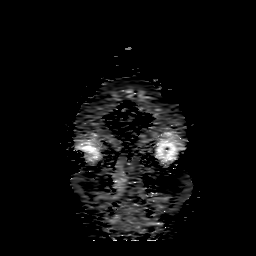
[im 19/37]
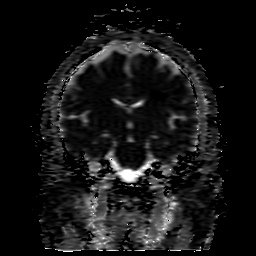
[im 37/37]
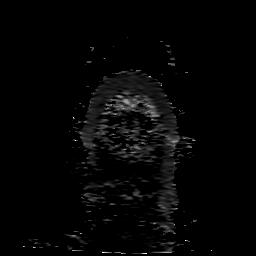

[35 of 48 positions shown; findings below may reference images not displayed]

FINDINGS: Brain: Patchy abnormal trace diffusion in the posterior lentiform,
and in scattered left frontal cortex, mostly in the middle MCA
division territory. There is a larger area of abnormal diffusion
which is restricted in the left centrum semiovale (series 350, image
36), and diffusion abnormality here tracks toward the motor strip,
corresponding to the area of CT abnormality. There is also a small
focus in the left periatrial white matter. Most of the other areas
are isointense on ADC.

There is associated T2 and FLAIR hyperintensity in the largest areas
of involvement. There is also petechial hemorrhage in the cortex or
subcortical white matter of the left motor strip (series 8, image
77).

No other acute intracranial hemorrhage. However, there are several
small contralateral and posterior fossa foci of restricted
diffusion, including one in the right occipital pole, and one in
each cerebellar hemisphere (e.g. series 3, image 13).

No superimposed chronic encephalomalacia identified. Overall normal
cerebral volume. No midline shift, mass effect, evidence of mass
lesion, ventriculomegaly, or extra-axial collection.
Cervicomedullary junction and pituitary are within normal limits.

Vascular: Major intracranial vascular flow voids are preserved.

Skull and upper cervical spine: Negative. Normal bone marrow signal.

Sinuses/Orbits: Negative.

Other: Visible internal auditory structures appear normal. Mastoids
are clear. No acute scalp or face soft tissue findings.
IMPRESSION: 1. Scattered small acute and subacute infarcts in the left MCA
territory, the largest of which corresponds to the subtle CT
abnormality yesterday.
2. There is associated petechial hemorrhage, but no malignant
hemorrhagic transformation or mass effect.
3. There are also several punctate acute infarcts in the posterior
circulation.
4. In light of this pattern and the normal CTA neck findings,
consider the possibility of a cardiac source embolic event.

## 2019-01-10 ENCOUNTER — Other Ambulatory Visit: Payer: Self-pay

## 2019-01-10 ENCOUNTER — Ambulatory Visit (INDEPENDENT_AMBULATORY_CARE_PROVIDER_SITE_OTHER): Payer: Self-pay | Admitting: Family Medicine

## 2019-01-10 ENCOUNTER — Encounter: Payer: Self-pay | Admitting: Family Medicine

## 2019-01-10 VITALS — BP 132/78 | HR 95 | Temp 96.7°F | Ht 68.0 in | Wt 239.0 lb

## 2019-01-10 DIAGNOSIS — J4520 Mild intermittent asthma, uncomplicated: Secondary | ICD-10-CM

## 2019-01-10 DIAGNOSIS — I10 Essential (primary) hypertension: Secondary | ICD-10-CM

## 2019-01-10 LAB — BASIC METABOLIC PANEL
BUN: 24 mg/dL — ABNORMAL HIGH (ref 6–23)
CO2: 26 mEq/L (ref 19–32)
Calcium: 9.7 mg/dL (ref 8.4–10.5)
Chloride: 104 mEq/L (ref 96–112)
Creatinine, Ser: 1.46 mg/dL (ref 0.40–1.50)
GFR: 63.64 mL/min (ref >60.00)
Glucose, Bld: 115 mg/dL — ABNORMAL HIGH (ref 70–99)
Potassium: 4.2 mEq/L (ref 3.5–5.1)
Sodium: 141 mEq/L (ref 135–145)

## 2019-01-10 MED ORDER — CLOPIDOGREL BISULFATE 75 MG PO TABS: 75.0000 mg | ORAL_TABLET | Freq: Every day | ORAL | 3 refills | Status: DC

## 2019-01-10 MED ORDER — ATORVASTATIN CALCIUM 80 MG PO TABS: 80.0000 mg | ORAL_TABLET | Freq: Every day | ORAL | 3 refills | Status: DC

## 2019-01-10 MED ORDER — ALBUTEROL SULFATE 108 (90 BASE) MCG/ACT IN AEPB: 1.0000 | INHALATION_SPRAY | Freq: Four times a day (QID) | RESPIRATORY_TRACT | 2 refills | Status: AC | PRN

## 2019-01-10 MED ORDER — AMLODIPINE BESYLATE 10 MG PO TABS: 10.0000 mg | ORAL_TABLET | Freq: Every day | ORAL | 3 refills | Status: DC

## 2019-01-10 MED ORDER — LOSARTAN POTASSIUM 100 MG PO TABS: 100.0000 mg | ORAL_TABLET | Freq: Every day | ORAL | 2 refills | Status: DC
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
## 2019-01-10 NOTE — Patient Instructions (Signed)
Give [place] 2-3 business days to get the results of your labs back.   Keep the diet clean and stay active.  Aim to do some physical exertion for 150 minutes per week. This is typically divided into 5 days per week, 30 minutes per day. The activity should be enough to get your heart rate up. Anything is better than nothing if you have time constraints.  Keep moving.  I recommend getting the flu shot in mid October. This suggestion would change if the CDC comes out with a different recommendation.   Let us know if you need anything.

## 2019-02-25 ENCOUNTER — Other Ambulatory Visit: Payer: Self-pay | Admitting: Family Medicine

## 2019-05-09 ENCOUNTER — Other Ambulatory Visit: Payer: Self-pay

## 2019-05-09 ENCOUNTER — Encounter: Payer: Self-pay | Admitting: Family Medicine

## 2019-05-09 ENCOUNTER — Ambulatory Visit (INDEPENDENT_AMBULATORY_CARE_PROVIDER_SITE_OTHER): Payer: Self-pay | Admitting: Family Medicine

## 2019-05-09 VITALS — BP 148/90 | HR 103 | Temp 97.0°F | Wt 250.0 lb

## 2019-05-09 DIAGNOSIS — I10 Essential (primary) hypertension: Secondary | ICD-10-CM

## 2019-05-09 MED ORDER — METOPROLOL TARTRATE 50 MG PO TABS: 50.0000 mg | ORAL_TABLET | Freq: Two times a day (BID) | ORAL | 3 refills | Status: DC

## 2019-05-09 MED ORDER — METOPROLOL SUCCINATE ER 50 MG PO TB24: 50.0000 mg | ORAL_TABLET | Freq: Every day | ORAL | 3 refills | Status: DC
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
## 2019-05-09 NOTE — Patient Instructions (Addendum)
Look at [facility] for options for your blood pressure medications.  Norvasc is free at [facility].   Losartan is on $9 list at [facility].  You should be able to find something cheaper for Lipitor.   Keep the diet clean and stay active.  Let us know if you need anything.

## 2019-05-12 ENCOUNTER — Ambulatory Visit: Payer: Self-pay | Admitting: Family Medicine

## 2019-05-22 ENCOUNTER — Other Ambulatory Visit: Payer: Self-pay

## 2019-05-23 ENCOUNTER — Ambulatory Visit (INDEPENDENT_AMBULATORY_CARE_PROVIDER_SITE_OTHER): Payer: Self-pay | Admitting: Family Medicine

## 2019-05-23 ENCOUNTER — Encounter: Payer: Self-pay | Admitting: Family Medicine

## 2019-05-23 VITALS — BP 132/89

## 2019-05-23 DIAGNOSIS — I10 Essential (primary) hypertension: Secondary | ICD-10-CM

## 2019-05-23 MED ORDER — METOPROLOL SUCCINATE ER 100 MG PO TB24: 100.0000 mg | ORAL_TABLET | Freq: Every day | ORAL | 3 refills | Status: DC
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
## 2019-06-20 ENCOUNTER — Other Ambulatory Visit: Payer: Self-pay

## 2019-06-21 ENCOUNTER — Encounter: Payer: Self-pay | Admitting: Family Medicine

## 2019-06-21 ENCOUNTER — Ambulatory Visit (INDEPENDENT_AMBULATORY_CARE_PROVIDER_SITE_OTHER): Payer: 59 | Admitting: Family Medicine

## 2019-06-21 ENCOUNTER — Other Ambulatory Visit: Payer: Self-pay

## 2019-06-21 VITALS — BP 138/98 | HR 85 | Temp 95.6°F | Ht 71.0 in | Wt 243.0 lb

## 2019-06-21 DIAGNOSIS — I10 Essential (primary) hypertension: Secondary | ICD-10-CM | POA: Diagnosis not present

## 2019-06-21 MED ORDER — CHLORTHALIDONE 25 MG PO TABS: 25.0000 mg | ORAL_TABLET | Freq: Every day | ORAL | 2 refills | Status: DC
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
## 2019-06-21 NOTE — Patient Instructions (Addendum)
Check Goodrx as an app or web site to check drug pricing.  See what your medicines cost with insurance.  Keep the diet clean and stay active.  Let us know if you need anything.

## 2019-06-28 ENCOUNTER — Other Ambulatory Visit: Payer: Self-pay

## 2019-06-28 ENCOUNTER — Other Ambulatory Visit (INDEPENDENT_AMBULATORY_CARE_PROVIDER_SITE_OTHER): Payer: 59

## 2019-06-28 DIAGNOSIS — I10 Essential (primary) hypertension: Secondary | ICD-10-CM

## 2019-06-28 LAB — BASIC METABOLIC PANEL
BUN: 20 mg/dL (ref 6–23)
CO2: 28 mEq/L (ref 19–32)
Calcium: 10.5 mg/dL (ref 8.4–10.5)
Chloride: 102 mEq/L (ref 96–112)
Creatinine, Ser: 1.35 mg/dL (ref 0.40–1.50)
GFR: 69.51 mL/min (ref >60.00)
Glucose, Bld: 109 mg/dL — ABNORMAL HIGH (ref 70–99)
Potassium: 4.2 mEq/L (ref 3.5–5.1)
Sodium: 139 mEq/L (ref 135–145)

## 2019-07-10 ENCOUNTER — Other Ambulatory Visit: Payer: Self-pay

## 2019-07-11 ENCOUNTER — Other Ambulatory Visit: Payer: Self-pay

## 2019-07-11 ENCOUNTER — Encounter: Payer: Self-pay | Admitting: Family Medicine

## 2019-07-11 ENCOUNTER — Other Ambulatory Visit: Payer: Self-pay | Admitting: Family Medicine

## 2019-07-11 ENCOUNTER — Ambulatory Visit (INDEPENDENT_AMBULATORY_CARE_PROVIDER_SITE_OTHER): Payer: 59 | Admitting: Family Medicine

## 2019-07-11 VITALS — BP 132/86 | HR 71 | Temp 95.5°F | Ht 71.0 in | Wt 248.0 lb

## 2019-07-11 DIAGNOSIS — Z0000 Encounter for general adult medical examination without abnormal findings: Secondary | ICD-10-CM | POA: Diagnosis not present

## 2019-07-11 LAB — COMPREHENSIVE METABOLIC PANEL
ALT: 20 U/L (ref 0–53)
AST: 13 U/L (ref 0–37)
Albumin: 3.9 g/dL (ref 3.5–5.2)
Alkaline Phosphatase: 66 U/L (ref 39–117)
BUN: 16 mg/dL (ref 6–23)
CO2: 29 mEq/L (ref 19–32)
Calcium: 9.1 mg/dL (ref 8.4–10.5)
Chloride: 105 mEq/L (ref 96–112)
Creatinine, Ser: 1.3 mg/dL (ref 0.40–1.50)
GFR: 72.59 mL/min (ref >60.00)
Glucose, Bld: 116 mg/dL — ABNORMAL HIGH (ref 70–99)
Potassium: 4.5 mEq/L (ref 3.5–5.1)
Sodium: 138 mEq/L (ref 135–145)
Total Bilirubin: 0.4 mg/dL (ref 0.2–1.2)
Total Protein: 6.7 g/dL (ref 6.0–8.3)

## 2019-07-11 LAB — LIPID PANEL
Cholesterol: 240 mg/dL — ABNORMAL HIGH (ref 0–200)
HDL: 39 mg/dL — ABNORMAL LOW (ref >39.00)
LDL Cholesterol: 166 mg/dL — ABNORMAL HIGH (ref 0–99)
NonHDL: 200.5
Total CHOL/HDL Ratio: 6
Triglycerides: 172 mg/dL — ABNORMAL HIGH (ref 0.0–149.0)
VLDL: 34.4 mg/dL (ref 0.0–40.0)

## 2019-07-11 MED ORDER — EZETIMIBE 10 MG PO TABS: 10.0000 mg | ORAL_TABLET | Freq: Every day | ORAL | 3 refills | Status: AC
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
## 2019-07-11 NOTE — Patient Instructions (Addendum)
Give [place] 2-3 business days to get the results of your labs back.   Keep the diet clean and stay active.  Let me know if your blood pressure does not eventually get to 130 or less on the top and 80 or less on the bottom.   Let us know if you need anything.

## 2019-07-12 ENCOUNTER — Other Ambulatory Visit: Payer: Self-pay | Admitting: Family Medicine

## 2019-07-12 DIAGNOSIS — E782 Mixed hyperlipidemia: Secondary | ICD-10-CM

## 2019-08-02 ENCOUNTER — Other Ambulatory Visit: Payer: Self-pay | Admitting: Family Medicine

## 2019-08-02 DIAGNOSIS — I10 Essential (primary) hypertension: Secondary | ICD-10-CM

## 2019-08-21 ENCOUNTER — Other Ambulatory Visit: Payer: Self-pay | Admitting: Family Medicine

## 2019-08-23 ENCOUNTER — Other Ambulatory Visit: Payer: 59

## 2019-10-19 ENCOUNTER — Other Ambulatory Visit: Payer: Self-pay | Admitting: Family Medicine

## 2019-10-19 DIAGNOSIS — I10 Essential (primary) hypertension: Secondary | ICD-10-CM

## 2020-01-12 ENCOUNTER — Ambulatory Visit: Payer: 59 | Admitting: Family Medicine

## 2020-01-30 ENCOUNTER — Other Ambulatory Visit: Payer: Self-pay

## 2020-01-30 ENCOUNTER — Ambulatory Visit (INDEPENDENT_AMBULATORY_CARE_PROVIDER_SITE_OTHER): Payer: 59 | Admitting: Family Medicine

## 2020-01-30 ENCOUNTER — Encounter: Payer: Self-pay | Admitting: Family Medicine

## 2020-01-30 VITALS — BP 120/80 | HR 71 | Temp 98.3°F | Ht 71.0 in | Wt 252.4 lb

## 2020-01-30 DIAGNOSIS — E6609 Other obesity due to excess calories: Secondary | ICD-10-CM | POA: Diagnosis not present

## 2020-01-30 DIAGNOSIS — I10 Essential (primary) hypertension: Secondary | ICD-10-CM | POA: Diagnosis not present

## 2020-01-30 DIAGNOSIS — Z6835 Body mass index (BMI) 35.0-35.9, adult: Secondary | ICD-10-CM | POA: Diagnosis not present

## 2020-01-30 MED ORDER — AMLODIPINE BESYLATE 10 MG PO TABS: 10.0000 mg | ORAL_TABLET | Freq: Every day | ORAL | 2 refills | Status: DC

## 2020-01-30 MED ORDER — LOSARTAN POTASSIUM 100 MG PO TABS: 100.0000 mg | ORAL_TABLET | Freq: Every day | ORAL | 2 refills | Status: DC

## 2020-01-30 MED ORDER — METOPROLOL SUCCINATE ER 50 MG PO TB24: 50.0000 mg | ORAL_TABLET | Freq: Every day | ORAL | 2 refills | Status: DC

## 2020-01-30 MED ORDER — METOPROLOL SUCCINATE ER 100 MG PO TB24: 100.0000 mg | ORAL_TABLET | Freq: Every day | ORAL | 2 refills | Status: AC

## 2020-01-30 MED ORDER — CHLORTHALIDONE 25 MG PO TABS: 25.0000 mg | ORAL_TABLET | Freq: Every day | ORAL | 2 refills | Status: DC
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
## 2020-01-30 NOTE — Patient Instructions (Addendum)
Keep the diet clean and stay active.  If you do not hear anything about your referral in the next 1-2 weeks, call our office and ask for an update.  I do recommend you get the COVID-19 vaccination, either Pfizer or Moderna.  Let us know if you need anything.

## 2020-02-23 ENCOUNTER — Other Ambulatory Visit: Payer: Self-pay | Admitting: Family Medicine

## 2020-02-23 DIAGNOSIS — I10 Essential (primary) hypertension: Secondary | ICD-10-CM

## 2020-05-22 ENCOUNTER — Encounter: Payer: Self-pay | Admitting: Family Medicine

## 2020-05-22 ENCOUNTER — Other Ambulatory Visit: Payer: Self-pay

## 2020-05-22 ENCOUNTER — Ambulatory Visit (INDEPENDENT_AMBULATORY_CARE_PROVIDER_SITE_OTHER): Payer: BLUE CROSS/BLUE SHIELD | Admitting: Family Medicine

## 2020-05-22 VITALS — BP 114/64 | HR 84 | Temp 98.2°F | Ht 71.0 in | Wt 258.0 lb

## 2020-05-22 DIAGNOSIS — N529 Male erectile dysfunction, unspecified: Secondary | ICD-10-CM

## 2020-05-22 DIAGNOSIS — H1033 Unspecified acute conjunctivitis, bilateral: Secondary | ICD-10-CM | POA: Diagnosis not present

## 2020-05-22 MED ORDER — SILDENAFIL CITRATE 100 MG PO TABS: 50.0000 mg | ORAL_TABLET | Freq: Every day | ORAL | 2 refills | Status: AC | PRN

## 2020-05-22 MED ORDER — POLYMYXIN B-TRIMETHOPRIM 10000-0.1 UNIT/ML-% OP SOLN: 1.0000 [drp] | OPHTHALMIC | 0 refills | Status: DC

## 2020-05-22 NOTE — Patient Instructions (Addendum)
Artificial tears like Refresh and Systane may be used for comfort. OK to get generic version. Generally people use them every 2-4 hours, but you can use them as much as you want because there is no medication in it.  Try not to touch your face.   Continue to wipe things down and keep things sanitary.   Make sure [facility] runs the Viagra through [facility]- should be around $20. If not, let me know.   Let us know if you need anything.
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
## 2020-05-23 DIAGNOSIS — H1033 Unspecified acute conjunctivitis, bilateral: Secondary | ICD-10-CM

## 2020-05-23 MED ORDER — POLYMYXIN B-TRIMETHOPRIM 10000-0.1 UNIT/ML-% OP SOLN: 1.0000 [drp] | OPHTHALMIC | 0 refills | Status: DC

## 2020-05-25 ENCOUNTER — Encounter (INDEPENDENT_AMBULATORY_CARE_PROVIDER_SITE_OTHER): Payer: Self-pay

## 2020-07-30 ENCOUNTER — Encounter: Payer: BLUE CROSS/BLUE SHIELD | Admitting: Family Medicine

## 2020-09-20 ENCOUNTER — Other Ambulatory Visit: Payer: Self-pay | Admitting: *Deleted

## 2020-09-20 ENCOUNTER — Ambulatory Visit: Payer: BLUE CROSS/BLUE SHIELD | Admitting: Family Medicine

## 2020-09-20 NOTE — Telephone Encounter (Signed)
Patient did not get the mychart message about 215pm appointment for today.  I called to check on him and he stated that he was unable to come to appointment and that he was in [place].  He will stop the losartan.  He asked for an appt on Monday with you.  Are you ok with Monday at 1015am?  Advised again that he go to [facility] or ER if he gets worse or feels any shortness of breath.

## 2020-09-20 NOTE — Telephone Encounter (Signed)
Patient has been scheduled for Monday.

## 2020-09-23 ENCOUNTER — Encounter: Payer: Self-pay | Admitting: Family Medicine

## 2020-09-23 ENCOUNTER — Other Ambulatory Visit: Payer: Self-pay

## 2020-09-23 ENCOUNTER — Ambulatory Visit (INDEPENDENT_AMBULATORY_CARE_PROVIDER_SITE_OTHER): Payer: BLUE CROSS/BLUE SHIELD | Admitting: Family Medicine

## 2020-09-23 VITALS — BP 136/80 | HR 64 | Temp 98.3°F | Ht 71.0 in | Wt 257.4 lb

## 2020-09-23 DIAGNOSIS — T465X5A Adverse effect of other antihypertensive drugs, initial encounter: Secondary | ICD-10-CM | POA: Diagnosis not present

## 2020-09-23 DIAGNOSIS — I10 Essential (primary) hypertension: Secondary | ICD-10-CM

## 2020-09-23 DIAGNOSIS — Z1211 Encounter for screening for malignant neoplasm of colon: Secondary | ICD-10-CM | POA: Diagnosis not present

## 2020-09-23 LAB — BASIC METABOLIC PANEL
BUN: 22 mg/dL (ref 6–23)
CO2: 30 mEq/L (ref 19–32)
Calcium: 10.3 mg/dL (ref 8.4–10.5)
Chloride: 99 mEq/L (ref 96–112)
Creatinine, Ser: 1.41 mg/dL (ref 0.40–1.50)
GFR: 60.35 mL/min (ref >60.00)
Glucose, Bld: 93 mg/dL (ref 70–99)
Potassium: 3.6 mEq/L (ref 3.5–5.1)
Sodium: 139 mEq/L (ref 135–145)
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
## 2020-09-23 NOTE — Patient Instructions (Addendum)
Let's avoid losartan and medications like it.  Give [place] 2-3 business days to get the results of your labs back.   Monitor your blood pressures, if it goes up, let me know.  If you do not hear anything about your referral in the next 1-2 weeks, call our office and ask for an update.   Let us know if you need anything.

## 2020-12-01 ENCOUNTER — Other Ambulatory Visit: Payer: Self-pay | Admitting: Family Medicine

## 2020-12-01 DIAGNOSIS — I10 Essential (primary) hypertension: Secondary | ICD-10-CM

## 2021-03-27 ENCOUNTER — Other Ambulatory Visit: Payer: Self-pay | Admitting: Family Medicine

## 2021-03-27 DIAGNOSIS — I10 Essential (primary) hypertension: Secondary | ICD-10-CM

## 2021-09-19 ENCOUNTER — Encounter: Payer: Self-pay | Admitting: Family Medicine

## 2021-09-19 MED ORDER — ATORVASTATIN CALCIUM 80 MG PO TABS: 80.0000 mg | ORAL_TABLET | Freq: Every day | ORAL | 3 refills | Status: AC

## 2021-12-10 ENCOUNTER — Encounter (INDEPENDENT_AMBULATORY_CARE_PROVIDER_SITE_OTHER): Payer: Self-pay
